# Patient Record
Sex: Female | Born: 1970 | Race: White | Hispanic: No | Smoking: Never smoker
Health system: Southern US, Community
[De-identification: ages and names within clinical notes are randomized; demographics above are authoritative.]

## PROBLEM LIST (undated history)

## (undated) DIAGNOSIS — J45998 Other asthma: Secondary | ICD-10-CM

## (undated) DIAGNOSIS — F419 Anxiety disorder, unspecified: Secondary | ICD-10-CM

## (undated) DIAGNOSIS — E282 Polycystic ovarian syndrome: Secondary | ICD-10-CM

## (undated) DIAGNOSIS — R112 Nausea with vomiting, unspecified: Secondary | ICD-10-CM

## (undated) DIAGNOSIS — N200 Calculus of kidney: Secondary | ICD-10-CM

## (undated) DIAGNOSIS — G4730 Sleep apnea, unspecified: Secondary | ICD-10-CM

## (undated) DIAGNOSIS — C801 Malignant (primary) neoplasm, unspecified: Secondary | ICD-10-CM

## (undated) DIAGNOSIS — F329 Major depressive disorder, single episode, unspecified: Secondary | ICD-10-CM

## (undated) DIAGNOSIS — G43909 Migraine, unspecified, not intractable, without status migrainosus: Secondary | ICD-10-CM

## (undated) DIAGNOSIS — Z9889 Other specified postprocedural states: Secondary | ICD-10-CM

## (undated) DIAGNOSIS — Z87442 Personal history of urinary calculi: Secondary | ICD-10-CM

## (undated) DIAGNOSIS — Z973 Presence of spectacles and contact lenses: Secondary | ICD-10-CM

## (undated) DIAGNOSIS — D509 Iron deficiency anemia, unspecified: Secondary | ICD-10-CM

## (undated) DIAGNOSIS — F32A Depression, unspecified: Secondary | ICD-10-CM

## (undated) DIAGNOSIS — K219 Gastro-esophageal reflux disease without esophagitis: Secondary | ICD-10-CM

## (undated) DIAGNOSIS — D649 Anemia, unspecified: Secondary | ICD-10-CM

## (undated) DIAGNOSIS — Z87820 Personal history of traumatic brain injury: Secondary | ICD-10-CM

## (undated) DIAGNOSIS — N393 Stress incontinence (female) (male): Secondary | ICD-10-CM

## (undated) HISTORY — PX: KNEE ARTHROSCOPY W/ ACL RECONSTRUCTION: SHX1858

## (undated) HISTORY — DX: Migraine, unspecified, not intractable, without status migrainosus: G43.909

## (undated) HISTORY — PX: TOTAL KNEE ARTHROPLASTY: SHX125

---

## 1999-11-25 ENCOUNTER — Other Ambulatory Visit: Admission: RE | Admit: 1999-11-25 | Discharge: 1999-11-25 | Payer: Self-pay | Admitting: Gynecology

## 1999-11-25 ENCOUNTER — Encounter (INDEPENDENT_AMBULATORY_CARE_PROVIDER_SITE_OTHER): Payer: Self-pay | Admitting: Specialist

## 2000-04-16 ENCOUNTER — Encounter: Payer: Self-pay | Admitting: Orthodontics and Dentofacial Orthopedics

## 2000-04-16 ENCOUNTER — Ambulatory Visit (HOSPITAL_COMMUNITY)
Admission: RE | Admit: 2000-04-16 | Discharge: 2000-04-16 | Payer: Self-pay | Admitting: Orthodontics and Dentofacial Orthopedics

## 2000-05-10 ENCOUNTER — Encounter: Admission: RE | Admit: 2000-05-10 | Discharge: 2000-08-08 | Payer: Self-pay | Admitting: Gynecology

## 2000-09-28 ENCOUNTER — Ambulatory Visit (HOSPITAL_COMMUNITY): Admission: RE | Admit: 2000-09-28 | Discharge: 2000-09-28 | Payer: Self-pay | Admitting: Orthopedic Surgery

## 2000-09-28 ENCOUNTER — Encounter: Payer: Self-pay | Admitting: Orthopedic Surgery

## 2000-10-12 ENCOUNTER — Ambulatory Visit (HOSPITAL_COMMUNITY): Admission: RE | Admit: 2000-10-12 | Discharge: 2000-10-12 | Payer: Self-pay | Admitting: Orthopedic Surgery

## 2000-10-12 ENCOUNTER — Encounter: Payer: Self-pay | Admitting: Orthopedic Surgery

## 2000-10-26 ENCOUNTER — Encounter: Payer: Self-pay | Admitting: Orthopedic Surgery

## 2000-10-26 ENCOUNTER — Ambulatory Visit (HOSPITAL_COMMUNITY): Admission: RE | Admit: 2000-10-26 | Discharge: 2000-10-26 | Payer: Self-pay | Admitting: Orthopedic Surgery

## 2000-11-25 ENCOUNTER — Other Ambulatory Visit: Admission: RE | Admit: 2000-11-25 | Discharge: 2000-11-25 | Payer: Self-pay | Admitting: Gynecology

## 2000-12-21 HISTORY — PX: BACK SURGERY: SHX140

## 2001-04-29 ENCOUNTER — Encounter: Payer: Self-pay | Admitting: Orthopedic Surgery

## 2001-04-29 ENCOUNTER — Encounter: Admission: RE | Admit: 2001-04-29 | Discharge: 2001-04-29 | Payer: Self-pay | Admitting: Orthopedic Surgery

## 2001-05-13 ENCOUNTER — Encounter: Payer: Self-pay | Admitting: Orthopedic Surgery

## 2001-05-13 ENCOUNTER — Encounter: Admission: RE | Admit: 2001-05-13 | Discharge: 2001-05-13 | Payer: Self-pay | Admitting: Orthopedic Surgery

## 2001-06-01 ENCOUNTER — Encounter: Payer: Self-pay | Admitting: Orthopedic Surgery

## 2001-06-01 ENCOUNTER — Encounter: Admission: RE | Admit: 2001-06-01 | Discharge: 2001-06-01 | Payer: Self-pay | Admitting: Orthopedic Surgery

## 2001-11-08 ENCOUNTER — Encounter: Payer: Self-pay | Admitting: Neurosurgery

## 2001-11-08 ENCOUNTER — Inpatient Hospital Stay (HOSPITAL_COMMUNITY): Admission: RE | Admit: 2001-11-08 | Discharge: 2001-11-12 | Payer: Self-pay | Admitting: Neurosurgery

## 2002-01-31 ENCOUNTER — Encounter: Payer: Self-pay | Admitting: Neurosurgery

## 2002-01-31 ENCOUNTER — Encounter: Admission: RE | Admit: 2002-01-31 | Discharge: 2002-01-31 | Payer: Self-pay | Admitting: Neurosurgery

## 2002-02-09 ENCOUNTER — Other Ambulatory Visit: Admission: RE | Admit: 2002-02-09 | Discharge: 2002-02-09 | Payer: Self-pay | Admitting: Obstetrics and Gynecology

## 2003-02-09 ENCOUNTER — Other Ambulatory Visit: Admission: RE | Admit: 2003-02-09 | Discharge: 2003-02-09 | Payer: Self-pay | Admitting: Gynecology

## 2004-04-04 ENCOUNTER — Encounter: Admission: RE | Admit: 2004-04-04 | Discharge: 2004-04-04 | Payer: Self-pay | Admitting: Family Medicine

## 2004-06-03 ENCOUNTER — Encounter: Admission: RE | Admit: 2004-06-03 | Discharge: 2004-06-03 | Payer: Self-pay | Admitting: Gastroenterology

## 2005-02-27 ENCOUNTER — Ambulatory Visit (HOSPITAL_COMMUNITY): Admission: RE | Admit: 2005-02-27 | Discharge: 2005-02-27 | Payer: Self-pay | Admitting: Gynecology

## 2005-04-23 ENCOUNTER — Inpatient Hospital Stay (HOSPITAL_COMMUNITY): Admission: AD | Admit: 2005-04-23 | Discharge: 2005-04-23 | Payer: Self-pay | Admitting: Gynecology

## 2005-05-08 ENCOUNTER — Other Ambulatory Visit: Admission: RE | Admit: 2005-05-08 | Discharge: 2005-05-08 | Payer: Self-pay | Admitting: Gynecology

## 2005-06-03 ENCOUNTER — Ambulatory Visit (HOSPITAL_COMMUNITY): Admission: RE | Admit: 2005-06-03 | Discharge: 2005-06-03 | Payer: Self-pay | Admitting: Gynecology

## 2005-07-11 ENCOUNTER — Inpatient Hospital Stay (HOSPITAL_COMMUNITY): Admission: AD | Admit: 2005-07-11 | Discharge: 2005-07-16 | Payer: Self-pay | Admitting: Gynecology

## 2005-07-13 ENCOUNTER — Encounter (INDEPENDENT_AMBULATORY_CARE_PROVIDER_SITE_OTHER): Payer: Self-pay | Admitting: *Deleted

## 2006-03-24 ENCOUNTER — Encounter: Admission: RE | Admit: 2006-03-24 | Discharge: 2006-03-24 | Payer: Self-pay | Admitting: Gynecology

## 2006-07-18 ENCOUNTER — Inpatient Hospital Stay (HOSPITAL_COMMUNITY): Admission: AD | Admit: 2006-07-18 | Discharge: 2006-07-19 | Payer: Self-pay | Admitting: Gynecology

## 2007-12-22 HISTORY — PX: REDUCTION MAMMAPLASTY: SUR839

## 2008-02-14 ENCOUNTER — Other Ambulatory Visit: Admission: RE | Admit: 2008-02-14 | Discharge: 2008-02-14 | Payer: Self-pay | Admitting: Family Medicine

## 2008-12-21 HISTORY — PX: BREAST SURGERY: SHX581

## 2009-04-16 ENCOUNTER — Other Ambulatory Visit: Admission: RE | Admit: 2009-04-16 | Discharge: 2009-04-16 | Payer: Self-pay | Admitting: Family Medicine

## 2009-05-21 ENCOUNTER — Ambulatory Visit: Payer: Self-pay | Admitting: Gynecology

## 2009-05-24 ENCOUNTER — Ambulatory Visit: Payer: Self-pay | Admitting: Gynecology

## 2010-04-30 ENCOUNTER — Other Ambulatory Visit: Admission: RE | Admit: 2010-04-30 | Discharge: 2010-04-30 | Payer: Self-pay | Admitting: Family Medicine

## 2010-12-21 DIAGNOSIS — G4733 Obstructive sleep apnea (adult) (pediatric): Secondary | ICD-10-CM

## 2010-12-21 HISTORY — DX: Obstructive sleep apnea (adult) (pediatric): G47.33

## 2011-02-10 ENCOUNTER — Other Ambulatory Visit: Payer: Self-pay | Admitting: Dermatology

## 2011-05-08 NOTE — Op Note (Signed)
[name]  Patient:    Adriana Newman, HUGE Visit Number: [id number] MRN: [id number]          Service Type: SUR Location: 3000 3040 01 Attending Physician:  Colon Branch Dictated by:   Clydene Fake, M.D. Proc. Date: 11/08/01 Admit Date:  11/08/2001                             Operative Report  PREOPERATIVE DIAGNOSIS:  Stenosis and spondylolisthesis, L5-S1.  POSTOPERATIVE DIAGNOSIS:  Stenosis and spondylolisthesis, L5-S1.  PROCEDURE: 1. Gill decompression of the lamina of L5-S1. 2. Posterior lumbar interbody fusion L5-S1 with Brantigan interbody cages at    L5-S1. 3. Pedicle screw fixation at L5-S1. 4. Autograft from same incision. 5. Open reduction and internal fixation of spondylolisthesis.  SURGEON:  Clydene Fake, M.D.  ASSISTANT:  Izell [place]. Elesa Hacker, M.D.  ANESTHESIA:  General endotracheal tube anesthesia.  ESTIMATED BLOOD LOSS:  250 cc.  BLOOD GIVEN:  None.  DRAINS:  None.  COMPLICATIONS:  None.  REASON FOR PROCEDURE:  The patient is a a young woman who has had years of back pain that has been progressively worsening with bilateral S1 radiculopathy, worse on the right.  Has some improvement with epidural injections, the last time though only lasted a couple of months.  The patient has progression.  She has undergone an MRI and myelogram showing grade 1 spondylolisthesis and stenosis at the 5-1 level.  She is brought in for decompression and fusion.  DESCRIPTION OF PROCEDURE:  The patient was brought in the operating room, and general anesthesia was induced.  The patient was placed in a prone position on the Wilson frame with all pressure points padded.  The patient was prepped and draped in a sterile fashion.  The site of incision was then injected with 20 cc of 1% lidocaine with epinephrine.  Incision was then made from the L4 spinous process down to the sacrum in the midline and the lower lumbar spine incision taken  to the fascia and hemostasis obtained with Bovie cauterization. The fascia was incised with the Bovie.  Subperiosteal dissection was done over the L4, L5 and the sacrum bilaterally.  Dissection was taken out to lateral to the facets at L4-5 and S5-1.  Fluoroscopic imaging was obtained to confirm the levels, and L5 laminectomy was performed along with a superior S1 laminectomy along with decompression of the pars and facets in a Gill-type decompression for good decompression of the spondylolisthesis.  The S1 roots were under severe compression in the ligament-bony canal and due to the spondylolisthesis.  L5 roots were under somewhat compression, and these were deep down into the incision.  We decompressed the L5 and S1 roots bilaterally. Attention was then taken to the disk space, and diskectomy was performed after incising the disk space.  The angle of the disk space was almost vertical to the rest of the patients back, making the operation fairly difficult.  After diskectomy, we distracted the disk space with interbody distractors up to 11 mm.  We then prepared the interspaces for interbody cages, and 11 mm high x 9 mm high posteriorly wedge Brantigan cages were then placed into the 5-1 interspace.  The bone removed from the laminectomy was chopped up into small pieces and to this was added some Grafton putty, and this mixture was then packed in with the cages.  Prior to placing the cages into the disk space, this  bone material was placed anterior to where the cages were going.  They were tapped into place under fluoroscopic imaging.  At this point we had restored some of the lordosis to the lumbar spine and reduced the spondylolisthesis slightly.  Attention was given to pedicle screw insertion sites at L5 and S1.  That bone was decorticated with the high-speed drill. Fluoroscopic imaging was used for guidance along with actual visualizing the medial pedicle and pedicle probe was placed  down the L5 pedicles bilaterally and then a 6.25 x 40 mm long _____ screw was then placed in each L5 pedicle. A 30 x 7 mm wide _____ was placed in S1 bilaterally.  Rods were bent to fit and then placed over the screw heads, and the locking nut was then placed on. We tightened the locking nut over the L5 screws bilaterally and then with compression over the 5-1 interspace compressing the cages, we then tightened the S1.  We did this bilaterally.  At this point, AP and lateral fluoroscopic imaging was obtained showing good position of the pedicle screws, rods, and the interbody cages.  We visually explored the cages.  They were tight.  There was no movement.  We again explored the nerve roots.  The L5 and S1 roots were decompressed.  We did use an osteophyte remover to remove the rim of S1 under the S1 roots to decrease the acuity of that angle and compression anteriorly of the S1 roots.  The wound was irrigated with antibiotic solution and epidural hemostasis was obtained with Gelfoam and thrombin.  Retractors were removed and the paraspinous muscles and fascia were closed with 0 Vicryl interrupted suture, the subcutaneous tissue was closed with 0, 2-0, and 3-0 Vicryl interrupted sutures, and the skin closed with Steri-Strips after benzoin was placed.   A dressing was applied.  The patient was placed back in a supine position, awoken from anesthesia, and transferred to the recovery room in stable condition. Dictated by:   Clydene Fake, M.D. Attending Physician:  Colon Branch DD:  11/08/01 TD:  11/09/01 Job: [id number]

## 2011-05-08 NOTE — Discharge Summary (Signed)
[place]  Patient:    Adriana Newman, Adriana Newman Visit Number: [id number] MRN: [id number]          Service Type: SUR Location: 3000 3040 01 Attending Physician:  Colon Branch Dictated by:   Clydene Fake, M.D. Admit Date:  11/08/2001 Discharge Date: 11/12/2001                             Discharge Summary  DIAGNOSIS:  Spondylolisthesis and stenosis at L5-S1.  DISCHARGE DIAGNOSIS:  Spondylolisthesis and stenosis at L5-S1.  PROCEDURE:  Gill decompressive laminectomy with posterior lumbar interbody fusion L5-S1 with interbody cages and pedicle screw fixation with autograft from the same incision and open reduction of spondylolisthesis.  REASON FOR ADMISSION:  The patient is a 40 year old woman who has had many years of back and leg pain, right worse than left but over the last year it has been progressing.  MRI and myelogram have been done showing stenosis, disc bulging and spondylolithesis at L5-S1.  There is no abnormal motion with flexion and extension though.  Epidural injections have helped but with less lasting effect as she has been getting more and the patient is brought in for decompression and fusion.  HOSPITAL COURSE:  The patient was admitted the day of surgery and underwent the procedure above without complications.  Postoperatively the patient was transferred to the recovery room and then to the floor.  There she was up ambulating with a brace and noted that she was moving her legs well.  She had a little left side foot numbness.  PT worked with the patient.  Her activity has been improving.  She had a little low grade fever probably consistent with atelectasis which has improved over the first couple of days.  She might have had a mild ileus but she had been passing flatus and eating better, though she no bowel movements at the time of discharge.  PCA was discontinued.  She was taking oral pain medicines well and having good pain  control.  She still complains of a little numbness in the left heel and the upper left leg that is an improvement from immediately after surgery and this will probably continue to resolve with further time.  Incision has remained clean, dry and intact.  CONDITION ON DISCHARGE:  To home in stable condition.  DISCHARGE MEDICATIONS:  Same as prehospitalization except no nonsteroidal anti-inflammatories.  She can have Percocet one to two p.o. q.4-6h. p.r.n. pain.  Flexeril 10 mg p.o. q.8h. p.r.n. spasms.  DIET:  As tolerated.  ACTIVITY:  With brace only.  No strenuous activity.  WOUND CARE:  Keep incision dry for five days.  FOLLOW-UP:  Will be in three weeks in my office. Dictated by:   Clydene Fake, M.D. Attending Physician:  Colon Branch DD:  11/12/01 TD:  11/14/01 Job: [id number]

## 2011-05-08 NOTE — Discharge Summary (Signed)
[name], Adriana Newman           ACCOUNT NO.:  [id number]   MEDICAL RECORD NO.:  [id number]          PATIENT TYPE:  INP   LOCATION:  9302                          [facility]ILITY[facility]   PHYSICIAN:  Juan H. Lily Peer, M.D.DATE OF BIRTH:  Apr 20, 1971   DATE OF ADMISSION:  07/11/2005  DATE OF DISCHARGE:  07/16/2005                                 DISCHARGE SUMMARY   Total days hospitalized:  5.   HISTORY:  The patient is a 40 year old at 22 weeks 5 days who was admitted  on July 11, 2005. The patient with known triplets. She was admitted in  preterm labor and was noted to have some slight bloody show. It was presumed  on admission that the slight bloody show had been attributed to her preterm  labor. She was started on tocolytic agents consisting of magnesium sulfate 4  g bolus followed by 2 g. Prior appropriate cultures for group B strep had  been obtained which the results later came back negative. But initially she  was started on clindamycin because of her penicillin allergies and  gentamycin was added later for more broader coverage of gram-negative  organisms. She continued to contract on and off and was passing blood clots  on several occasions. She had had an ultrasound which demonstrated no  measurable cervix. The internal os found to be 3 cm dilated. She had a DIC  panel that was drawn which was normal. The intention was to discontinue the  magnesium sulfate if there was any assumption of a potential abruptio  placenta. Fetal heart tones were appreciated on all three of the fetuses,  but no gross evidence of abruption was noted on the ultrasound. She was  continued on her magnesium sulfate and tocolytic agents with follow up of  her DIC panel which continued to remain stable. She had been crossmatched  for 2 units of blood in the event that she would of needed it. On the  morning of July 24 she continued to contract and was having much greater  bloody show, and she was found to be  5 cm dilated with bulging membranes and  a foot could be felt through the sac. She was counseled and taken for  primary lower uterine segment transverse cesarean section which was  performed by Dr. Douglass Rivers and assisted by Gaetano Hawthorne. Lily Peer, M.D.  Findings:  There was no gross evidence of abruption. There was clear  amniotic fluid throughout all three sacs. Baby A was delivered viable,  Apgars were 4, 4, and 7. Birth weight was 575 g and had been in the breech  presentation. Baby B was a viable female, Apgars of 7 and 8, birth weight of  447 g and vertex. Baby C was a viable female, Apgars of 1, 1, 5 and 7. Birth  weight of 422 g, delivered vertex. Normal uterus without any evidence of  Couvelaire changes. The ovaries were enlarged bilaterally. Postpartum day #1  hemoglobin/hematocrit were 8.8 and 26.1 respectively with a platelet count  259,000. The patient was afebrile. Her blood type is O positive, rubella  immune. By the first postoperative day,  all three babies had died in the  neonatal intensive care unit. Pastoral care had been provided for the  patient. Her postoperative day #2 she was grieving but vital signs were  fine. She was kept another day in the hospital and she was started on  Effexor for depression consisting of 150 mg daily. She was given  prescription of Percocet 5/325 one p.o. q.4-6h. p.r.n. and Ambien CR 12.5  p.o. q.h.s. p.r.n. for insomnia. She was essentially discharged home on the  following day, on July 27 on her third postoperative day with stable vital  signs.Marland Kitchen   FINAL DIAGNOSES:  1.  A 23-week triplet pregnancy, delivered.  2.  Questionable abruptio.  3.  Advanced cervical dilatation.   PROCEDURE PERFORMED:  Primary lower uterine segment transverse cesarean.   FINAL DISPOSITION AND FOLLOW-UP:  By the first postoperative day all three  babies had died in the neonatal intensive care unit of prematurity. The  patient had been started on antidepressants to  help her with her emotions  and had been started on Effexor 150 mg daily and for pain relief she was  given Percocet 5/325 one to two tablets p.o.  q.4-6h. p.r.n. pain, and for insomnia she was provided with Ambien CR 12.5  mg q.h.s. p.r.n.Marland Kitchen She is instructed to continue prenatal vitamins and iron  and was instructed to follow up in the office in 2-3 weeks for her first  postoperative visit and to see how she was doing with the antidepressant  agent.      Juan H. Lily Peer, M.D.  Electronically Signed     JHF/MEDQ  D:  08/18/2005  T:  08/18/2005  Job:  [id number]

## 2011-05-08 NOTE — Op Note (Signed)
Adriana Newman, Adriana Newman           ACCOUNT NO.:  [id number]   MEDICAL RECORD NO.:  [id number]          PATIENT TYPE:  INP   LOCATION:  9199                          FACILITY:  [facility]   PHYSICIAN:  Ivor Costa. Farrel Gobble, M.D. DATE OF BIRTH:  1971/01/16   DATE OF PROCEDURE:  07/13/2005  DATE OF DISCHARGE:                                 OPERATIVE REPORT   PREOPERATIVE DIAGNOSES:  1.  Twenty-three week triplet pregnancy.  2.  Questionable abruption, baby C.  3.  Advanced cervical dilation.   POSTOPERATIVE DIAGNOSES:  1.  Twenty-three week triplet pregnancy.  2.  Questionable abruption, baby C.  3.  Advanced cervical dilation.   PROCEDURE:  Primary cesarean section, low flap transverse.   SURGEONS:  Ivor Costa. Farrel Gobble, M.D., and Gaetano Hawthorne. Lily Peer, M.D.   ANESTHESIA:  Spinal.   IV FLUIDS:  1600 mL of lactated Ringer's.   ESTIMATED BLOOD LOSS:  500 mL.   URINE OUTPUT:  500 mL of clear urine.   FINDINGS:  There was no gross evidence of abruption.  There was clear  amniotic fluid noted around all three sacs.  Baby A was delivered of a  viable female, delivery confirmed a portion of the infant was in the maternal  vagina.  Membranes were intact.  Apgars were 4, 4 and 7, birth weight was  575, and he was delivered breech.  Baby B was a viable female, Apgars were 7  and 8, birth weight was 447, and vertex.  Baby C was a viable female, Apgars  were 1, 1, 5 and 7, birth weight was 422, delivered vertex.  Normal uterus  without any evidence of Couvelaire changes.  The ovaries were enlarged  bilaterally.   COMPLICATIONS:  None.   PATHOLOGY:  Placenta x3.   PROCEDURE:  The patient was taken to the operating room and spinal  anesthesia was induced, placed in the dorsal lithotomy position, prepped and  draped in the usual sterile fashion.  After adequate anesthesia was ensured,  a Pfannenstiel skin incision was made with a scalpel and carried through to  the underlying layer of fascia with  electrocautery.  The fascia was scored  in the midline and the incision was extended laterally with electrocautery.  The inferior aspect of the fascial incision was grasped with Kochers.  The  underlying rectus muscles were dissected off by blunt and sharp dissection.  In a similar fashion, the superior aspect of the incision was grasped with  Kochers and the underlying rectus muscles were dissected off.  The rectus  muscles were naturally separated in the midline.  The peritoneum was  identified and entered bluntly.  The peritoneal incision was then extended  superiorly and inferiorly with good visualization of the underlying bowel  and bladder.  The bladder blade was inserted, the vesicouterine peritoneum  was identified, tented up and entered sharply with the Metzenbaums.  The  incision was extended laterally, the bladder flap was created digitally.  The bladder blade was then reinserted and the lower uterine segment was  incised in a transverse fashion with a scalpel.  The uterine cavity was then  entered bluntly and the incision was also extended bluntly.  Immediately  upon entering the cavity, baby A's vertex was visible.  An examining hand  was placed into the uterus.  The baby was elevated, and it was noted to be  markedly in the vagina.  He was elevated, brought through the uterus, and  then an amniotomy was performed.  The cord was cut and clamped and he was  handed off to the waiting pediatrician.  The vertex of baby B, female, was  then held, and she was brought similarly through the uterine incision and  again an amniotomy was performed.  The cord was cut and clamped and again  handed off to the waiting pediatrician.  A cord clamp was placed for later  identification.  Baby C was similarly brought into the incision, amniotomy,  and cord cut and clamped with two clamps left for later identification.  The  placenta of A was transected, that of C, spontaneously separated.  The   placenta of baby B was manually removed.  The uterus was then cleared of all  clots and debris.  The uterine incision was repaired with a running locked  layer of 0 chromic and a second suture was used for imbrication.  The pelvis  was irrigated with copious amounts of warm saline.  The adnexa were  inspected and found as above.  The uterus was closely inspected, and there  were no Couvelaire changes.  The uterus also contracted nicely post  delivery.  The peritoneum, fascia and muscle were then inspected and treated  where appropriate.  The fascia was closed with 0 Vicryl in a running  fashion.  The subcu was irrigated.  The skin was closed with 4-0 Vicryl on a  Mellody Dance, followed by injection of 10 mL of 0.25% Marcaine and Steri-Strips.  The patient tolerated the procedure well.  Sponge, lap and needle count was  correct x2, and she was transferred to the PACU in stable condition.  She  recieved Ancef intra-operatively.      Ivor Costa. Farrel Gobble, M.D.  Electronically Signed     THL/MEDQ  D:  07/13/2005  T:  07/13/2005  Job:  [id number]

## 2011-05-08 NOTE — H&P (Signed)
. [facility]  Patient:    Adriana Newman, Adriana Newman Visit Number: [id number] MRN: [id number]          Service Type: SUR Location: 3000 3040 01 Attending Physician:  Colon Branch Dictated by:   Clydene Fake, M.D. Admit Date:  11/08/2001                           History and Physical  CHIEF COMPLAINT:  Back and leg pain, right worse than left.  HISTORY:  Patient is a 40 year old woman who for years has had back and leg pain that has been worsening over the last year or so.  In September of 2001, she had an MRI and found to have a stenosis, disk bulging and spondylolisthesis at L5-S1.  She was treated with epidural steroid injections which did give her relief for about four to five months and then the pain recurred.  She went through another series of epidural injections, the last in June; relief was only two to three months, with some pain recurring and now worsening.  She has been through physical therapy and does try to continue doing the exercises on her own.  Pain is across the back, worse with sitting. The pain radiates down the right leg to the bottom of foot, especially when walking, and occasionally this happens with the left side also.  Occasionally, she has burning-type pain.  She has been on Neurontin in the past which did not help, currently on Celebrex which gives a little relief.  No bowel or bladder complaints.  No weakness or numbness.  Workup included an MRI and a myelogram which shows spondylolithesis at 5-1 with degenerative disk disease and disk height narrowing, no abnormal motion with flexion and extension; there is the grade 1 spondylolisthesis.  There is stenosis at the 5-1 level due to spur and disk bulge and very small canal and a kink in the S1 roots on the myelogram.  It is seen right as the roots go over them with the best one. Patient is brought in for decompression and fusion.  PAST MEDICAL HISTORY:  Significant for  arthritis, depression, polycystic ovarian syndrome.  PAST SURGICAL HISTORY:  Previous operations include bilateral knee surgeries, two on the left and two on the right.  CURRENT MEDICATIONS:  Lo/Ovral, spironolactone, Celebrex, Effexor, Allegra, Skelaxin.  DRUG ALLERGIES:  PENICILLIN, which caused hives as a child.  SOCIAL HISTORY:  She is single, employed as a Runner, broadcasting/film/video for the fifth grade, does not smoke or drink alcohol.  REVIEW OF SYSTEMS:  Otherwise negative.  FAMILY HISTORY:  Noncontributory.  PHYSICAL EXAMINATION:  GENERAL:  Patient is pleasant, in no apparent distress.  Weight is 189 pounds. Height 5 feet 7 inches.  VITAL SIGNS:  Blood pressure 117/95, pulse 78, temperature 98.6.  HEENT:  Unremarkable.  NECK:  Supple.  LUNGS:  Clear.  HEART:  Regular rhythm.  ABDOMEN:  Soft and nontender.  EXTREMITIES:  Intact.  No edema.  BACK AND NEUROLOGIC:  Exam show that range of motion of the back is done well in flexion and extension and it does not increase her symptoms.  Gait is done normally; she can ambulate on heels and toes.  Straight leg raising is positive bilaterally with pain radiating down towards the heel, worse on the right side.  Sensation is intact to light touch and pinprick.  Deep tendon reflexes are 3+ at the knees, 2 at the ankles, equal bilaterally.  ______ motor testing shows 5/5 in all muscle groups.  ASSESSMENT AND PLAN:  Patient admitted for decompression and fusion of L5-S1. Dictated by:   Clydene Fake, M.D. Attending Physician:  Colon Branch DD:  11/08/01 TD:  11/09/01 Job: [id number]

## 2011-05-08 NOTE — H&P (Signed)
NAMEVALLERY, Adriana Newman           ACCOUNT NO.:  [id number]   MEDICAL RECORD NO.:  [id number]          PATIENT TYPE:  INP   LOCATION:  9149                          [facility]   PHYSICIAN:  Charles A. Delcambre, MDDATE OF BIRTH:  September 21, 1971   DATE OF ADMISSION:  07/11/2005  DATE OF DISCHARGE:                                HISTORY & PHYSICAL   CHIEF COMPLAINT:  Vaginal bleeding, cramping, triplet pregnancy.   HISTORY OF PRESENT ILLNESS:  A 40 year old para 0-0-0-0, estimated  gestational age [redacted] weeks 5 days, [facility] November 09, 2005, who presented  complaining of passing a small clot and some cramping.  She was noted to  have contractions every four to five minutes after an ultrasound showed the  cervix to be 1.2 cm with normal otherwise gestational findings on a triplet  pregnancy, with fetal heart tones noted on each baby.  She notes active  fetal movement and otherwise denies ruptured membranes.  She denies fever or  chills, urgency, frequency or dysuria.   PAST MEDICAL HISTORY:  Depression, asthma, GERD, allergies.   SURGICAL HISTORY:  Knee surgery, two on each side.  Spinal fusion.   MEDICATIONS:  1.  Prenatal vitamins.  2.  Effexor XR 75 mg daily.  3.  Pulmicort Turbohaler one to two puffs daily.  4.  Proventil inhaler two puffs q.4-6h. p.r.n.  5.  Protonix 40 mg p.o. daily.  6.  Zyrtec daily p.r.n.   ALLERGIES:  PENICILLIN causes hives.   SOCIAL HISTORY:  No tobacco, ethanol or drug use.  The patient is married.   FAMILY HISTORY:  States no major illnesses, noncontributory.   PHYSICAL EXAMINATION:  VITAL SIGNS:  Normal vital signs per R.N.  See  admission sheets.  HEENT:  Grossly normal.  CHEST:  Lungs are clear bilaterally.  CARDIAC:  Regular rate and rhythm.  ABDOMEN:  Soft, nontender, gravid.  PELVIC:  Per R.N. in MAU, stated 1 cm dilated, no significant bleeding noted  in MAU.  EXTREMITIES:  Minimal edema, nontender.   ASSESSMENT:  Preterm labor versus  incompetent cervix, triplet pregnancy at  22 weeks 5 days.  Favor preterm labor.   PLAN:  1.  Group B strep culture.  2.  Clindamycin.  3.  Urinalysis.  4.  CBC is on the chart.  5.  Magnesium tocolysis.  6.  Complete bed rest.  7.  FHT's q shift  8.  Discussed management, extended bed rest, tocolysis, neonatology      consultation would be in order if once reached viability gestation.      Would proceed on with tocolysis at this time, withhold steroids at this      gestation.  With contractions, will hold on at this gestation in a      triplet pregnancy a rescue cerclage although it has been considered.      The patient agrees with management as outlined.  Possibility to convert      over to Procardia or terbutaline with response to magnesium tocolysis.      Consisder BMZ as [redacted] weeks gestational age approaches, maintain strict  bedrest for now.       CAD/MEDQ  D:  07/11/2005  T:  07/11/2005  Job:  [id number]

## 2011-12-07 ENCOUNTER — Other Ambulatory Visit: Payer: Self-pay | Admitting: Orthopedic Surgery

## 2011-12-08 ENCOUNTER — Encounter (HOSPITAL_COMMUNITY): Payer: Self-pay

## 2011-12-11 ENCOUNTER — Encounter (HOSPITAL_COMMUNITY): Payer: Self-pay

## 2011-12-11 ENCOUNTER — Ambulatory Visit (HOSPITAL_COMMUNITY)
Admission: RE | Admit: 2011-12-11 | Discharge: 2011-12-11 | Disposition: A | Discharge status: Home / Self Care | Payer: BC Managed Care – PPO | Source: Ambulatory Visit | Attending: Orthopedic Surgery | Admitting: Orthopedic Surgery

## 2011-12-11 ENCOUNTER — Encounter (HOSPITAL_COMMUNITY)
Admission: RE | Admit: 2011-12-11 | Discharge: 2011-12-11 | Disposition: A | Discharge status: Home / Self Care | Payer: BC Managed Care – PPO | Source: Ambulatory Visit | Attending: Orthopedic Surgery | Admitting: Orthopedic Surgery

## 2011-12-11 ENCOUNTER — Other Ambulatory Visit: Payer: Self-pay

## 2011-12-11 DIAGNOSIS — J45909 Unspecified asthma, uncomplicated: Secondary | ICD-10-CM | POA: Insufficient documentation

## 2011-12-11 DIAGNOSIS — Z01810 Encounter for preprocedural cardiovascular examination: Secondary | ICD-10-CM | POA: Insufficient documentation

## 2011-12-11 DIAGNOSIS — Z01812 Encounter for preprocedural laboratory examination: Secondary | ICD-10-CM | POA: Insufficient documentation

## 2011-12-11 DIAGNOSIS — Z01818 Encounter for other preprocedural examination: Secondary | ICD-10-CM | POA: Insufficient documentation

## 2011-12-11 HISTORY — DX: Major depressive disorder, single episode, unspecified: F32.9

## 2011-12-11 HISTORY — DX: Sleep apnea, unspecified: G47.30

## 2011-12-11 HISTORY — DX: Polycystic ovarian syndrome: E28.2

## 2011-12-11 HISTORY — DX: Depression, unspecified: F32.A

## 2011-12-11 HISTORY — DX: Nausea with vomiting, unspecified: R11.2

## 2011-12-11 HISTORY — DX: Nausea with vomiting, unspecified: Z98.890

## 2011-12-11 HISTORY — DX: Anemia, unspecified: D64.9

## 2011-12-11 HISTORY — DX: Gastro-esophageal reflux disease without esophagitis: K21.9

## 2011-12-11 LAB — HCG, SERUM, QUALITATIVE: Preg, Serum: NEGATIVE

## 2011-12-11 LAB — DIFFERENTIAL
Basophils Absolute: 0 10*3/uL (ref 0.0–0.1)
Lymphocytes Relative: 36 % (ref 12–46)
Lymphs Abs: 2.1 10*3/uL (ref 0.7–4.0)
Neutro Abs: 3.3 10*3/uL (ref 1.7–7.7)
Neutrophils Relative %: 55 % (ref 43–77)

## 2011-12-11 LAB — URINE MICROSCOPIC-ADD ON

## 2011-12-11 LAB — CBC
HCT: 38.6 % (ref 36.0–46.0)
Hemoglobin: 13.4 g/dL (ref 12.0–15.0)
MCH: 32.3 pg (ref 26.0–34.0)
MCHC: 34.7 g/dL (ref 30.0–36.0)
MCV: 93 fL (ref 78.0–100.0)

## 2011-12-11 LAB — COMPREHENSIVE METABOLIC PANEL
BUN: 13 mg/dL (ref 6–23)
Calcium: 9.5 mg/dL (ref 8.4–10.5)
Creatinine, Ser: 0.72 mg/dL (ref 0.50–1.10)
GFR calc Af Amer: >90 mL/min (ref >90)
GFR calc non Af Amer: >90 mL/min (ref >90)
Glucose, Bld: 94 mg/dL (ref 70–99)
Sodium: 139 mEq/L (ref 135–145)
Total Protein: 7.2 g/dL (ref 6.0–8.3)

## 2011-12-11 LAB — PROTIME-INR
INR: 0.91 (ref 0.00–1.49)
Prothrombin Time: 12.4 seconds (ref 11.6–15.2)

## 2011-12-11 LAB — TYPE AND SCREEN: Antibody Screen: NEGATIVE

## 2011-12-11 LAB — URINALYSIS, ROUTINE W REFLEX MICROSCOPIC
Bilirubin Urine: NEGATIVE
Hgb urine dipstick: NEGATIVE
Ketones, ur: NEGATIVE mg/dL
Nitrite: NEGATIVE
Urobilinogen, UA: 1 mg/dL (ref 0.0–1.0)

## 2011-12-11 LAB — SURGICAL PCR SCREEN: Staphylococcus aureus: NEGATIVE

## 2011-12-11 NOTE — Pre-Procedure Instructions (Signed)
20 Adriana Newman  12/11/2011   Your procedure is scheduled on:  Wednesday, Dec 26  Report to [facility] at 1100 AM.  Call this number if you have problems the morning of surgery: [phone]   Remember:   Do not eat food:After Midnight.  May have clear liquids: up to 4 Hours before arrival.  Clear liquids include soda, tea, black coffee, apple or grape juice, broth.  Take these medicines the morning of surgery with A SIP OF WATER: *Cymbalta,nasal spray, pulmicort,Prilosec,Hydrocodone**   Do not wear jewelry, make-up or nail polish.  Do not wear lotions, powders, or perfumes. You may wear deodorant.  Do not shave 48 hours prior to surgery.  Do not bring valuables to the hospital.  Contacts, dentures or bridgework may not be worn into surgery.  Leave suitcase in the car. After surgery it may be brought to your room.  For patients admitted to the hospital, checkout time is 11:00 AM the day of discharge.   Patients discharged the day of surgery will not be allowed to drive home.  Name and phone number of your driver: *n/a**  Special Instructions: Incentive Spirometry - Practice and bring it with you on the day of surgery. and CHG Shower Use Special Wash: 1/2 bottle night before surgery and 1/2 bottle morning of surgery.   Please read over the following fact sheets that you were given: Pain Booklet, Coughing and Deep Breathing, Blood Transfusion Information, MRSA Information and Surgical Site Infection Prevention

## 2011-12-15 MED ORDER — VANCOMYCIN HCL 1000 MG IV SOLR
1500.0000 mg | INTRAVENOUS | Status: AC
  Administered 2011-12-16: 1500 mg via INTRAVENOUS
  Filled 2011-12-15: qty 1500

## 2011-12-16 ENCOUNTER — Inpatient Hospital Stay (HOSPITAL_COMMUNITY)
Admission: RE | Admit: 2011-12-16 | Discharge: 2011-12-19 | DRG: 756 | Disposition: A | Discharge status: Home / Self Care | Payer: BC Managed Care – PPO | Source: Ambulatory Visit | Attending: Orthopedic Surgery | Admitting: Orthopedic Surgery

## 2011-12-16 ENCOUNTER — Encounter (HOSPITAL_COMMUNITY): Payer: Self-pay | Admitting: Anesthesiology

## 2011-12-16 ENCOUNTER — Encounter (HOSPITAL_COMMUNITY): Payer: Self-pay | Admitting: *Deleted

## 2011-12-16 ENCOUNTER — Ambulatory Visit (HOSPITAL_COMMUNITY): Payer: BC Managed Care – PPO | Admitting: Anesthesiology

## 2011-12-16 ENCOUNTER — Encounter (HOSPITAL_COMMUNITY)
Admission: RE | Disposition: A | Discharge status: Home / Self Care | Payer: Self-pay | Source: Ambulatory Visit | Attending: Orthopedic Surgery

## 2011-12-16 ENCOUNTER — Ambulatory Visit (HOSPITAL_COMMUNITY): Payer: BC Managed Care – PPO

## 2011-12-16 DIAGNOSIS — M5416 Radiculopathy, lumbar region: Secondary | ICD-10-CM

## 2011-12-16 DIAGNOSIS — K219 Gastro-esophageal reflux disease without esophagitis: Secondary | ICD-10-CM | POA: Diagnosis present

## 2011-12-16 DIAGNOSIS — D509 Iron deficiency anemia, unspecified: Secondary | ICD-10-CM | POA: Diagnosis present

## 2011-12-16 DIAGNOSIS — J45909 Unspecified asthma, uncomplicated: Secondary | ICD-10-CM | POA: Diagnosis present

## 2011-12-16 DIAGNOSIS — E282 Polycystic ovarian syndrome: Secondary | ICD-10-CM | POA: Diagnosis present

## 2011-12-16 DIAGNOSIS — Z6835 Body mass index (BMI) 35.0-35.9, adult: Secondary | ICD-10-CM

## 2011-12-16 DIAGNOSIS — G4730 Sleep apnea, unspecified: Secondary | ICD-10-CM | POA: Diagnosis present

## 2011-12-16 DIAGNOSIS — F3289 Other specified depressive episodes: Secondary | ICD-10-CM | POA: Diagnosis present

## 2011-12-16 DIAGNOSIS — F329 Major depressive disorder, single episode, unspecified: Secondary | ICD-10-CM | POA: Diagnosis present

## 2011-12-16 DIAGNOSIS — M5126 Other intervertebral disc displacement, lumbar region: Principal | ICD-10-CM | POA: Diagnosis present

## 2011-12-16 SURGERY — POSTERIOR LUMBAR FUSION 1 LEVEL
Anesthesia: General | Site: Spine Lumbar | Laterality: Right | Wound class: Clean

## 2011-12-16 MED ORDER — SODIUM CHLORIDE 0.9 % IJ SOLN: 9.0000 mL | INTRAMUSCULAR | Status: DC | PRN

## 2011-12-16 MED ORDER — FENTANYL CITRATE 0.05 MG/ML IJ SOLN
INTRAMUSCULAR | Status: DC | PRN
  Administered 2011-12-16: 100 ug via INTRAVENOUS
  Administered 2011-12-16: 50 ug via INTRAVENOUS
  Administered 2011-12-16: 150 ug via INTRAVENOUS
  Administered 2011-12-16: 50 ug via INTRAVENOUS
  Administered 2011-12-16: 100 ug via INTRAVENOUS
  Administered 2011-12-16: 50 ug via INTRAVENOUS
  Administered 2011-12-16: 100 ug via INTRAVENOUS

## 2011-12-16 MED ORDER — BUPIVACAINE-EPINEPHRINE 0.25% -1:200000 IJ SOLN
INTRAMUSCULAR | Status: DC | PRN
  Administered 2011-12-16: 10 mL

## 2011-12-16 MED ORDER — NALOXONE HCL 0.4 MG/ML IJ SOLN
0.4000 mg | INTRAMUSCULAR | Status: DC | PRN
  Filled 2011-12-16: qty 1

## 2011-12-16 MED ORDER — 0.9 % SODIUM CHLORIDE (POUR BTL) OPTIME
TOPICAL | Status: DC | PRN
  Administered 2011-12-16: 1000 mL

## 2011-12-16 MED ORDER — MENTHOL 3 MG MT LOZG: 1.0000 | LOZENGE | OROMUCOSAL | Status: DC | PRN

## 2011-12-16 MED ORDER — ZOLPIDEM TARTRATE 5 MG PO TABS: 5.0000 mg | ORAL_TABLET | Freq: Every evening | ORAL | Status: DC | PRN

## 2011-12-16 MED ORDER — ACETAMINOPHEN 325 MG PO TABS
650.0000 mg | ORAL_TABLET | ORAL | Status: DC | PRN
  Administered 2011-12-19: 650 mg via ORAL
  Filled 2011-12-16: qty 2

## 2011-12-16 MED ORDER — HYDROMORPHONE HCL PF 1 MG/ML IJ SOLN: 0.2500 mg | INTRAMUSCULAR | Status: DC | PRN

## 2011-12-16 MED ORDER — METFORMIN HCL 500 MG PO TABS
1000.0000 mg | ORAL_TABLET | Freq: Two times a day (BID) | ORAL | Status: DC
  Administered 2011-12-17 – 2011-12-19 (×5): 1000 mg via ORAL
  Filled 2011-12-16 (×7): qty 2

## 2011-12-16 MED ORDER — DIPHENHYDRAMINE HCL 50 MG/ML IJ SOLN
12.5000 mg | Freq: Four times a day (QID) | INTRAMUSCULAR | Status: DC | PRN
  Filled 2011-12-16: qty 0.25

## 2011-12-16 MED ORDER — ACETAMINOPHEN 10 MG/ML IV SOLN
INTRAVENOUS | Status: DC | PRN
  Administered 2011-12-16: 1000 mg via INTRAVENOUS

## 2011-12-16 MED ORDER — HYDROMORPHONE HCL 2 MG PO TABS
1.0000 mg | ORAL_TABLET | ORAL | Status: DC | PRN
  Filled 2011-12-16: qty 1

## 2011-12-16 MED ORDER — HYDROXYZINE HCL 50 MG PO TABS
50.0000 mg | ORAL_TABLET | ORAL | Status: DC | PRN
  Filled 2011-12-16: qty 1

## 2011-12-16 MED ORDER — DROPERIDOL 2.5 MG/ML IJ SOLN
INTRAMUSCULAR | Status: DC | PRN
  Administered 2011-12-16: .625 mg via INTRAVENOUS

## 2011-12-16 MED ORDER — DIAZEPAM 5 MG PO TABS: 5.0000 mg | ORAL_TABLET | Freq: Four times a day (QID) | ORAL | Status: DC | PRN

## 2011-12-16 MED ORDER — PROPOFOL 10 MG/ML IV EMUL
INTRAVENOUS | Status: DC | PRN
  Administered 2011-12-16: 40 ug/kg/min via INTRAVENOUS

## 2011-12-16 MED ORDER — ACETAMINOPHEN 10 MG/ML IV SOLN
INTRAVENOUS | Status: AC
  Filled 2011-12-16: qty 100

## 2011-12-16 MED ORDER — VITAMIN D3 25 MCG (1000 UNIT) PO TABS
2000.0000 [IU] | ORAL_TABLET | Freq: Every day | ORAL | Status: DC
  Administered 2011-12-17 – 2011-12-18 (×2): 2000 [IU] via ORAL
  Filled 2011-12-16 (×3): qty 2

## 2011-12-16 MED ORDER — DIPHENHYDRAMINE HCL 12.5 MG/5ML PO ELIX
12.5000 mg | ORAL_SOLUTION | Freq: Four times a day (QID) | ORAL | Status: DC | PRN
  Filled 2011-12-16: qty 5

## 2011-12-16 MED ORDER — FLUTICASONE PROPIONATE HFA 44 MCG/ACT IN AERO
2.0000 | INHALATION_SPRAY | Freq: Two times a day (BID) | RESPIRATORY_TRACT | Status: DC
  Administered 2011-12-17 – 2011-12-18 (×4): 2 via RESPIRATORY_TRACT
  Filled 2011-12-16: qty 10.6

## 2011-12-16 MED ORDER — POTASSIUM CHLORIDE IN NACL 20-0.9 MEQ/L-% IV SOLN
INTRAVENOUS | Status: DC
  Administered 2011-12-16 – 2011-12-18 (×2): via INTRAVENOUS
  Filled 2011-12-16 (×6): qty 1000

## 2011-12-16 MED ORDER — SCOPOLAMINE 1 MG/3DAYS TD PT72
MEDICATED_PATCH | TRANSDERMAL | Status: DC | PRN
  Administered 2011-12-16: 1 via TRANSDERMAL

## 2011-12-16 MED ORDER — PANTOPRAZOLE SODIUM 40 MG PO TBEC
40.0000 mg | DELAYED_RELEASE_TABLET | Freq: Every day | ORAL | Status: DC
  Administered 2011-12-17 – 2011-12-18 (×2): 40 mg via ORAL
  Filled 2011-12-16 (×4): qty 1

## 2011-12-16 MED ORDER — FLUTICASONE PROPIONATE 50 MCG/ACT NA SUSP
2.0000 | Freq: Every day | NASAL | Status: DC
  Administered 2011-12-17 – 2011-12-18 (×2): 2 via NASAL
  Filled 2011-12-16: qty 16

## 2011-12-16 MED ORDER — ONDANSETRON HCL 4 MG/2ML IJ SOLN: 4.0000 mg | Freq: Once | INTRAMUSCULAR | Status: DC | PRN

## 2011-12-16 MED ORDER — LACTATED RINGERS IV SOLN
INTRAVENOUS | Status: DC | PRN
  Administered 2011-12-16: 13:00:00 via INTRAVENOUS

## 2011-12-16 MED ORDER — HEMOSTATIC AGENTS (NO CHARGE) OPTIME
TOPICAL | Status: DC | PRN
  Administered 2011-12-16: 1 via TOPICAL

## 2011-12-16 MED ORDER — VANCOMYCIN HCL IN DEXTROSE 1-5 GM/200ML-% IV SOLN
1000.0000 mg | Freq: Two times a day (BID) | INTRAVENOUS | Status: DC
  Administered 2011-12-17 – 2011-12-18 (×4): 1000 mg via INTRAVENOUS
  Filled 2011-12-16 (×6): qty 200

## 2011-12-16 MED ORDER — SODIUM CHLORIDE 0.9 % IV SOLN: 250.0000 mL | INTRAVENOUS | Status: DC

## 2011-12-16 MED ORDER — THROMBIN 20000 UNITS EX KIT: PACK | CUTANEOUS | Status: DC | PRN

## 2011-12-16 MED ORDER — HYDROXYZINE HCL 50 MG/ML IM SOLN: 50.0000 mg | INTRAMUSCULAR | Status: DC | PRN

## 2011-12-16 MED ORDER — LACTATED RINGERS IV SOLN
INTRAVENOUS | Status: DC
  Administered 2011-12-16 (×3): via INTRAVENOUS

## 2011-12-16 MED ORDER — DULOXETINE HCL 60 MG PO CPEP
90.0000 mg | ORAL_CAPSULE | Freq: Every day | ORAL | Status: DC
  Administered 2011-12-17 – 2011-12-19 (×3): 90 mg via ORAL
  Filled 2011-12-16 (×3): qty 1

## 2011-12-16 MED ORDER — VANCOMYCIN HCL 1000 MG IV SOLR
1500.0000 mg | Freq: Once | INTRAVENOUS | Status: AC
  Administered 2011-12-16: 1500 mg via INTRAVENOUS
  Filled 2011-12-16: qty 1500

## 2011-12-16 MED ORDER — ROCURONIUM BROMIDE 100 MG/10ML IV SOLN
INTRAVENOUS | Status: DC | PRN
  Administered 2011-12-16: 50 mg via INTRAVENOUS

## 2011-12-16 MED ORDER — PROPOFOL 10 MG/ML IV EMUL
INTRAVENOUS | Status: DC | PRN
  Administered 2011-12-16: 200 mg via INTRAVENOUS

## 2011-12-16 MED ORDER — POVIDONE-IODINE 7.5 % EX SOLN
Freq: Once | CUTANEOUS | Status: DC
  Filled 2011-12-16: qty 118

## 2011-12-16 MED ORDER — ACETAMINOPHEN 650 MG RE SUPP: 650.0000 mg | RECTAL | Status: DC | PRN

## 2011-12-16 MED ORDER — PHENOL 1.4 % MT LIQD
1.0000 | OROMUCOSAL | Status: DC | PRN
  Filled 2011-12-16: qty 177

## 2011-12-16 MED ORDER — SODIUM CHLORIDE 0.9 % IJ SOLN: 3.0000 mL | INTRAMUSCULAR | Status: DC | PRN

## 2011-12-16 MED ORDER — FERROUS SULFATE 325 (65 FE) MG PO TABS
325.0000 mg | ORAL_TABLET | Freq: Every day | ORAL | Status: DC
  Administered 2011-12-17 – 2011-12-18 (×2): 325 mg via ORAL
  Filled 2011-12-16 (×3): qty 1

## 2011-12-16 MED ORDER — THROMBIN 20000 UNITS EX KIT
PACK | OROMUCOSAL | Status: DC | PRN
  Administered 2011-12-16: 14:00:00 via TOPICAL

## 2011-12-16 MED ORDER — ONDANSETRON HCL 4 MG/2ML IJ SOLN
4.0000 mg | Freq: Four times a day (QID) | INTRAMUSCULAR | Status: DC | PRN
  Filled 2011-12-16: qty 2

## 2011-12-16 MED ORDER — HYDROMORPHONE 0.3 MG/ML IV SOLN
INTRAVENOUS | Status: DC
  Administered 2011-12-16: 7.5 mg via INTRAVENOUS
  Administered 2011-12-17: 0.799 mg via INTRAVENOUS
  Administered 2011-12-17: 1.39 mg via INTRAVENOUS

## 2011-12-16 MED ORDER — HYDROMORPHONE HCL PF 1 MG/ML IJ SOLN: 0.5000 mg | INTRAMUSCULAR | Status: DC | PRN

## 2011-12-16 MED ORDER — SODIUM CHLORIDE 0.9 % IJ SOLN
3.0000 mL | Freq: Two times a day (BID) | INTRAMUSCULAR | Status: DC
  Administered 2011-12-17 – 2011-12-18 (×2): 3 mL via INTRAVENOUS

## 2011-12-16 MED ORDER — MEPERIDINE HCL 25 MG/ML IJ SOLN: 6.2500 mg | INTRAMUSCULAR | Status: DC | PRN

## 2011-12-16 MED ORDER — THERA M PLUS PO TABS
1.0000 | ORAL_TABLET | Freq: Every day | ORAL | Status: DC
  Administered 2011-12-17 – 2011-12-18 (×2): 1 via ORAL
  Filled 2011-12-16 (×3): qty 1

## 2011-12-16 MED ORDER — ONDANSETRON HCL 4 MG/2ML IJ SOLN
INTRAMUSCULAR | Status: DC | PRN
  Administered 2011-12-16: 4 mg via INTRAVENOUS

## 2011-12-16 MED ORDER — MIDAZOLAM HCL 5 MG/5ML IJ SOLN
INTRAMUSCULAR | Status: DC | PRN
  Administered 2011-12-16: 2 mg via INTRAVENOUS

## 2011-12-16 MED ORDER — DEXAMETHASONE SODIUM PHOSPHATE 4 MG/ML IJ SOLN
INTRAMUSCULAR | Status: DC | PRN
  Administered 2011-12-16: 8 mg via INTRAVENOUS

## 2011-12-16 MED ORDER — SODIUM CHLORIDE 0.9 % IV SOLN
INTRAVENOUS | Status: DC | PRN
  Administered 2011-12-16: 18:00:00 via INTRAVENOUS

## 2011-12-16 MED ORDER — VANCOMYCIN HCL IN DEXTROSE 1-5 GM/200ML-% IV SOLN
1000.0000 mg | Freq: Two times a day (BID) | INTRAVENOUS | Status: DC
  Filled 2011-12-16: qty 200

## 2011-12-16 SURGICAL SUPPLY — 75 items
APL SKNCLS STERI-STRIP NONHPOA (GAUZE/BANDAGES/DRESSINGS)
BENZOIN TINCTURE PRP APPL 2/3 (GAUZE/BANDAGES/DRESSINGS) IMPLANT
BLADE SURG ROTATE 9660 (MISCELLANEOUS) IMPLANT
BUR ROUND PRECISION 4.0 (BURR) ×2 IMPLANT
CAGE BULLET CONCORDE 9X9X27 (Cage) ×1 IMPLANT
CARTRIDGE OIL MAESTRO DRILL (MISCELLANEOUS) IMPLANT
CLOTH BEACON ORANGE TIMEOUT ST (SAFETY) ×2 IMPLANT
CONT SPEC STER OR (MISCELLANEOUS) ×2 IMPLANT
CORDS BIPOLAR (ELECTRODE) ×2 IMPLANT
COVER SURGICAL LIGHT HANDLE (MISCELLANEOUS) ×2 IMPLANT
DIFFUSER DRILL AIR PNEUMATIC (MISCELLANEOUS) IMPLANT
DRAIN CHANNEL 15F RND FF W/TCR (WOUND CARE) IMPLANT
DRAPE C-ARM 42X72 X-RAY (DRAPES) ×2 IMPLANT
DRAPE ORTHO SPLIT 77X108 STRL (DRAPES) ×2
DRAPE POUCH INSTRU U-SHP 10X18 (DRAPES) ×2 IMPLANT
DRAPE SURG 17X23 STRL (DRAPES) ×6 IMPLANT
DRAPE SURG ORHT 6 SPLT 77X108 (DRAPES) ×1 IMPLANT
DRSG TEGADERM 8X12 (GAUZE/BANDAGES/DRESSINGS) ×1 IMPLANT
DURAPREP 26ML APPLICATOR (WOUND CARE) ×2 IMPLANT
ELECT BLADE 4.0 EZ CLEAN MEGAD (MISCELLANEOUS) ×2
ELECT CAUTERY BLADE 6.4 (BLADE) ×3 IMPLANT
ELECT REM PT RETURN 9FT ADLT (ELECTROSURGICAL) ×2
ELECTRODE BLDE 4.0 EZ CLN MEGD (MISCELLANEOUS) ×1 IMPLANT
ELECTRODE REM PT RTRN 9FT ADLT (ELECTROSURGICAL) ×1 IMPLANT
EVACUATOR SILICONE 100CC (DRAIN) IMPLANT
GAUZE SPONGE 4X4 16PLY XRAY LF (GAUZE/BANDAGES/DRESSINGS) ×8 IMPLANT
GLOVE BIO SURGEON STRL SZ8 (GLOVE) ×2 IMPLANT
GLOVE BIOGEL PI IND STRL 8 (GLOVE) ×1 IMPLANT
GLOVE BIOGEL PI INDICATOR 8 (GLOVE) ×1
GOWN PREVENTION PLUS XLARGE (GOWN DISPOSABLE) ×2 IMPLANT
GOWN STRL NON-REIN LRG LVL3 (GOWN DISPOSABLE) ×4 IMPLANT
IV CATH 14GX2 1/4 (CATHETERS) ×2 IMPLANT
KIT BASIN OR (CUSTOM PROCEDURE TRAY) ×2 IMPLANT
KIT POSITION SURG JACKSON T1 (MISCELLANEOUS) ×2 IMPLANT
KIT ROOM TURNOVER OR (KITS) ×2 IMPLANT
MARKER SKIN DUAL TIP RULER LAB (MISCELLANEOUS) ×2 IMPLANT
NDL HYPO 25GX1X1/2 BEV (NEEDLE) ×1 IMPLANT
NDL SPNL 18GX3.5 QUINCKE PK (NEEDLE) ×2 IMPLANT
NEEDLE BONE MARROW 8GX6 FENEST (NEEDLE) ×1 IMPLANT
NEEDLE HYPO 25GX1X1/2 BEV (NEEDLE) ×2 IMPLANT
NEEDLE SPNL 18GX3.5 QUINCKE PK (NEEDLE) ×4 IMPLANT
NS IRRIG 1000ML POUR BTL (IV SOLUTION) ×2 IMPLANT
OIL CARTRIDGE MAESTRO DRILL (MISCELLANEOUS)
PACK LAMINECTOMY ORTHO (CUSTOM PROCEDURE TRAY) ×2 IMPLANT
PACK UNIVERSAL I (CUSTOM PROCEDURE TRAY) ×2 IMPLANT
PACK VITOSS BIOACTIVE 10CC (Neuro Prosthesis/Implant) ×1 IMPLANT
PAD ARMBOARD 7.5X6 YLW CONV (MISCELLANEOUS) ×4 IMPLANT
PATTIES SURGICAL .5 X1 (DISPOSABLE) ×2 IMPLANT
PATTIES SURGICAL .5X1.5 (GAUZE/BANDAGES/DRESSINGS) ×2 IMPLANT
SCREW EXPEDIUM POLYAXIAL 7X40M (Screw) ×2 IMPLANT
SCREW POLYAXIAL 7X45MM (Screw) ×2 IMPLANT
SCREW SET SINGLE INNER (Screw) ×6 IMPLANT
SPONGE GAUZE 4X4 12PLY (GAUZE/BANDAGES/DRESSINGS) ×2 IMPLANT
SPONGE INTESTINAL PEANUT (DISPOSABLE) ×2 IMPLANT
SPONGE SURGIFOAM ABS GEL 100 (HEMOSTASIS) ×2 IMPLANT
STRIP CLOSURE SKIN 1/2X4 (GAUZE/BANDAGES/DRESSINGS) ×1 IMPLANT
SURGIFLO TRUKIT (HEMOSTASIS) IMPLANT
SUT MNCRL AB 3-0 PS2 18 (SUTURE) ×2 IMPLANT
SUT MNCRL AB 4-0 PS2 18 (SUTURE) ×2 IMPLANT
SUT VIC AB 0 CT1 18XCR BRD 8 (SUTURE) ×1 IMPLANT
SUT VIC AB 0 CT1 8-18 (SUTURE) ×2
SUT VIC AB 1 CT1 18XCR BRD 8 (SUTURE) ×2 IMPLANT
SUT VIC AB 1 CT1 8-18 (SUTURE) ×4
SUT VIC AB 2-0 CT2 18 VCP726D (SUTURE) ×2 IMPLANT
SYR 20CC LL (SYRINGE) ×2 IMPLANT
SYR BULB IRRIGATION 50ML (SYRINGE) ×2 IMPLANT
SYR CONTROL 10ML LL (SYRINGE) ×4 IMPLANT
TAPE CLOTH SURG 4X10 WHT LF (GAUZE/BANDAGES/DRESSINGS) ×1 IMPLANT
TOWEL OR 17X24 6PK STRL BLUE (TOWEL DISPOSABLE) ×2 IMPLANT
TOWEL OR 17X26 10 PK STRL BLUE (TOWEL DISPOSABLE) ×2 IMPLANT
TRAY FOLEY CATH 14FR (SET/KITS/TRAYS/PACK) ×2 IMPLANT
WATER STERILE IRR 1000ML POUR (IV SOLUTION) ×2 IMPLANT
YANKAUER SUCT BULB TIP NO VENT (SUCTIONS) ×2 IMPLANT
expedium 5.5 polyaxial screw 8x30mm ×2 IMPLANT
expedium 5.5 pre-bent rod 65mm ×2 IMPLANT

## 2011-12-16 NOTE — Transfer of Care (Signed)
Immediate Anesthesia Transfer of Care Note  Patient: Adriana Newman  Procedure(s) Performed:  POSTERIOR LUMBAR FUSION 1 LEVEL - Right sided lumbar 4-5 transforaminal lumbar interbody fusion with revision, and instrumentation.  Patient Location: PACU  Anesthesia Type: General  Level of Consciousness: sedated  Airway & Oxygen Therapy: Patient Spontanous Breathing and Patient connected to face mask oxygen  Post-op Assessment: Report given to PACU RN and Post -op Vital signs reviewed and stable  Post vital signs: Reviewed and stable  Complications: No apparent anesthesia complications

## 2011-12-16 NOTE — Anesthesia Procedure Notes (Signed)
Procedure Name: Intubation Date/Time: 12/16/2011 1:03 PM Performed by: Gwenyth Allegra Pre-anesthesia Checklist: Patient identified, Timeout performed, Emergency Drugs available, Suction available and Patient being monitored Patient Re-evaluated:Patient Re-evaluated prior to inductionOxygen Delivery Method: Circle System Utilized Preoxygenation: Pre-oxygenation with 100% oxygen Intubation Type: IV induction Ventilation: Mask ventilation without difficulty Grade View: Grade II Tube type: Oral Tube size: 7.5 mm Number of attempts: 1 Placement Confirmation: ETT inserted through vocal cords under direct vision,  positive ETCO2 and breath sounds checked- equal and bilateral Secured at: 22 cm Tube secured with: Tape Dental Injury: Teeth and Oropharynx as per pre-operative assessment

## 2011-12-16 NOTE — Progress Notes (Signed)
PHARMACY - ANTIBIOTIC CONSULT  INITIAL NOTE  Pharmacy Consult for: Vancomycin  Indication: Surgical prophylaxis   Patient Data:   Allergies: Allergies  Allergen Reactions  . Adhesive (Tape) Itching    Band aids cause redness , Swelling and itching  . Morphine And Related Other (See Comments)    Pt states Morphine does not relieve pain  . Penicillins Hives  . Percocet (Oxycodone-Acetaminophen) Itching    Patient Measurements: Height: 5\' 7"  (170.2 cm) Weight: 229 lb (103.874 kg) IBW/kg (Calculated) : 61.6  Adjusted Body Weight:   Vital Signs: Temp:  [97.6 F (36.4 C)-98 F (36.7 C)] 97.8 F (36.6 C) (12/26 2115) Pulse Rate:  [79-120] 104  (12/26 2132) Resp:  [13-20] 18  (12/26 2132) BP: (116-138)/(63-84) 132/82 mmHg (12/26 2131) SpO2:  [93 %-100 %] 93 % (12/26 2132) Weight:  [229 lb (103.874 kg)] 229 lb (103.874 kg) (12/26 2017)  Intake/Output from previous day:  Intake/Output Summary (Last 24 hours) at 12/16/11 2217 Last data filed at 12/16/11 2105  Gross per 24 hour  Intake   4265 ml  Output   1850 ml  Net   2415 ml    Labs: No results found for this basename: WBC:3,HGB:3,PLT:3,LABCREA:3,CREATININE:3 in the last 72 hours Estimated Creatinine Clearance: 114.7 ml/min (by C-G formula based on Cr of 0.72). No results found for this basename: VANCOTROUGH:2,VANCOPEAK:2,VANCORANDOM:2,GENTTROUGH:2,GENTPEAK:2,GENTRANDOM:2,TOBRATROUGH:2,TOBRAPEAK:2,TOBRARND:2,AMIKACINPEAK:2,AMIKACINTROU:2,AMIKACIN:2, in the last 72 hours   Microbiology: Recent Results (from the past 720 hour(s))  SURGICAL PCR SCREEN     Status: Normal   Collection Time   12/11/11  3:27 PM      Component Value Range Status Comment   MRSA, PCR NEGATIVE  NEGATIVE  Final    Staphylococcus aureus NEGATIVE  NEGATIVE  Final     Medical History: Past Medical History  Diagnosis Date  . PONV (postoperative nausea and vomiting)     prefers to have scope patch  . Asthma     allergy induced  . Sleep apnea  2012    uses cpap  . Anemia     iron deficiency  . GERD (gastroesophageal reflux disease)   . Depression   . Polycystic ovarian disease     takes Metformin    Scheduled Medications:     . DULoxetine  90 mg Oral Daily  . fluticasone  2 spray Each Nare Daily  . fluticasone  2 puff Inhalation BID  . HYDROmorphone PCA 0.3 mg/mL   Intravenous Q4H  . Iron  1 tablet Oral Daily  . metFORMIN  1,000 mg Oral BID WC  . multivitamins ther. w/minerals  1 tablet Oral Daily  . pantoprazole  40 mg Oral Q1200  . sodium chloride  3 mL Intravenous Q12H  . vancomycin  1,500 mg Intravenous 60 min Pre-Op  . Vitamin D  1 capsule Oral Daily  . DISCONTD: povidone-iodine   Topical Once      Assessment:  40 y.o. female admitted on 12/16/2011 s/p spinal surgery with drain placement. Pharmacy consulted to manage vancomycin. Patient received 1.5gm IV vancomycin at 12:45.    Goal of Therapy:  1. Vancomycin trough level 15-20 mcg/ml  Plan:  1. Vancomycin 1.5gm IV x 1, and then 1gm IV Q12H.  2. Will order vancomycin troughs when indicated.    Dineen Kid Thad Ranger, PharmD 12/16/2011, 10:17 PM

## 2011-12-16 NOTE — Anesthesia Preprocedure Evaluation (Addendum)
Anesthesia Evaluation  Patient identified by MRN, date of birth, ID band Patient awake    Reviewed: Allergy & Precautions, H&P , NPO status , Patient's Chart, lab work & pertinent test results  History of Anesthesia Complications (+) PONVHistory of anesthetic complications: Scopolamine patch worked in the past.  Airway Mallampati: I TM Distance: >3 FB Neck ROM: Full    Dental No notable dental hx. (+) Teeth Intact and Dental Advisory Given   Pulmonary asthma (Uses Inhaler) , sleep apnea and Continuous Positive Airway Pressure Ventilation ,  clear to auscultation  Pulmonary exam normal       Cardiovascular Regular Normal    Neuro/Psych    GI/Hepatic GERD-  Medicated and Controlled,  Endo/Other  Morbid obesity  Renal/GU      Musculoskeletal   Abdominal   Peds  Hematology   Anesthesia Other Findings   Reproductive/Obstetrics                        Anesthesia Physical Anesthesia Plan  ASA: II  Anesthesia Plan: General   Post-op Pain Management:    Induction: Intravenous  Airway Management Planned: Oral ETT  Additional Equipment:   Intra-op Plan:   Post-operative Plan: Extubation in OR  Informed Consent: I have reviewed the patients History and Physical, chart, labs and discussed the procedure including the risks, benefits and alternatives for the proposed anesthesia with the patient or authorized representative who has indicated his/her understanding and acceptance.   Dental advisory given  Plan Discussed with: CRNA, Anesthesiologist and Surgeon  Anesthesia Plan Comments:         Anesthesia Quick Evaluation

## 2011-12-16 NOTE — Anesthesia Postprocedure Evaluation (Signed)
  Anesthesia Post-op Note  Patient: Adriana Newman  Procedure(s) Performed:  POSTERIOR LUMBAR FUSION 1 LEVEL - Right sided lumbar 4-5 transforaminal lumbar interbody fusion with revision, and instrumentation.  Patient Location: PACU  Anesthesia Type: General  Level of Consciousness: awake, oriented, sedated and patient cooperative  Airway and Oxygen Therapy: Patient Spontanous Breathing and Patient connected to nasal cannula oxygen  Post-op Pain: moderate  Post-op Assessment: Post-op Vital signs reviewed, Patient's Cardiovascular Status Stable, Respiratory Function Stable, Patent Airway, No signs of Nausea or vomiting and Pain level controlled  Post-op Vital Signs: stable  Complications: No apparent anesthesia complications

## 2011-12-16 NOTE — Preoperative (Signed)
Beta Blockers   Reason not to administer Beta Blockers:Not Applicable 

## 2011-12-16 NOTE — H&P (Signed)
PREOPERATIVE H&P  Chief Complaint: right leg pain  HPI: Adriana Newman is a 40 y.o. female who presents with right leg pain   Past Medical History  Diagnosis Date  . PONV (postoperative nausea and vomiting)     prefers to have scope patch  . Asthma     allergy induced  . Sleep apnea 2012    uses cpap  . Anemia     iron deficiency  . GERD (gastroesophageal reflux disease)   . Depression   . Polycystic ovarian disease     takes Metformin   Past Surgical History  Procedure Date  . Back surgery 2002    L5-S1 fusion  . Knee arthroscopy w/ acl reconstruction [id number]    bilateral  . Breast surgery 2010    reduction  . Cesarean section 2006   History   Social History  . Marital Status: Married    Spouse Name: N/A    Number of Children: N/A  . Years of Education: N/A   Social History Main Topics  . Smoking status: Never Smoker   . Smokeless tobacco: Never Used  . Alcohol Use: No  . Drug Use: No  . Sexually Active: Yes    Birth Control/ Protection: None   Other Topics Concern  . None   Social History Narrative  . None   History reviewed. No pertinent family history. Allergies  Allergen Reactions  . Adhesive (Tape) Itching    Band aids cause redness , Swelling and itching  . Morphine And Related Other (See Comments)    Pt states Morphine does not relieve pain  . Penicillins Hives  . Percocet (Oxycodone-Acetaminophen) Itching   Prior to Admission medications   Medication Sig Start Date End Date Taking? Authorizing Provider  budesonide (PULMICORT FLEXHALER) 180 MCG/ACT inhaler Inhale 1 puff into the lungs 2 (two) times daily.     Yes Historical Provider, MD  CALCIUM PO Take 1 tablet by mouth daily.     Yes Historical Provider, MD  Cholecalciferol (VITAMIN D) 2000 UNITS CAPS Take 1 capsule by mouth daily.     Yes Historical Provider, MD  DULoxetine (CYMBALTA) 30 MG capsule Take 90 mg by mouth daily.     Yes Historical Provider, MD  Ferrous Sulfate (IRON)  325 (65 FE) MG TABS Take 1 tablet by mouth daily.     Yes Historical Provider, MD  fluticasone (FLONASE) 50 MCG/ACT nasal spray Place 2 sprays into the nose daily.     Yes Historical Provider, MD  HYDROcodone-acetaminophen (NORCO) 10-325 MG per tablet Take 1 tablet by mouth every 6 (six) hours as needed.     Yes Historical Provider, MD  metFORMIN (GLUCOPHAGE) 1000 MG tablet Take 1,000 mg by mouth 2 (two) times daily with a meal.     Yes Historical Provider, MD  Multiple Vitamins-Minerals (MULTIVITAMINS THER. W/MINERALS) TABS Take 1 tablet by mouth daily.     Yes Historical Provider, MD  Omeprazole Magnesium (PRILOSEC OTC PO) Take 1 tablet by mouth daily.     Yes Historical Provider, MD     All other systems have been reviewed and were otherwise negative with the exception of those mentioned in the HPI and as above.  Physical Exam: Filed Vitals:   12/16/11 1021  BP: 116/76  Pulse: 79  Temp: 98 F (36.7 C)  Resp: 18    General: Alert, no acute distress Cardiovascular: No pedal edema Respiratory: No cyanosis, no use of accessory musculature GI: No organomegaly, abdomen is soft  and non-tender Skin: No lesions in the area of chief complaint Neurologic: Sensation intact distally Psychiatric: Patient is competent for consent with normal mood and affect Lymphatic: No axillary or cervical lymphadenopathy  MUSCULOSKELETAL: 4/5 EHL and dorsiflexion on the right, 5/5 on left  Assessment/Plan: Right leg pain Plan for Procedure(s): Right L4/5 TLIF with revision instrumentation   Lindalee Huizinga LEONARD 12/16/2011 12:16 PM

## 2011-12-17 LAB — CBC
HCT: 34.3 % — ABNORMAL LOW (ref 36.0–46.0)
Hemoglobin: 11.3 g/dL — ABNORMAL LOW (ref 12.0–15.0)
MCV: 93.7 fL (ref 78.0–100.0)
RBC: 3.66 MIL/uL — ABNORMAL LOW (ref 3.87–5.11)
WBC: 8.5 10*3/uL (ref 4.0–10.5)

## 2011-12-17 MED ORDER — HYDROMORPHONE HCL 2 MG PO TABS: 2.0000 mg | ORAL_TABLET | ORAL | Status: DC | PRN

## 2011-12-17 MED ORDER — HYDROCODONE-ACETAMINOPHEN 5-325 MG PO TABS
1.0000 | ORAL_TABLET | ORAL | Status: DC | PRN
  Administered 2011-12-17: 1 via ORAL
  Administered 2011-12-17 (×2): 2 via ORAL
  Administered 2011-12-17: 1 via ORAL
  Administered 2011-12-18 – 2011-12-19 (×4): 2 via ORAL
  Filled 2011-12-17: qty 1
  Filled 2011-12-17 (×2): qty 2
  Filled 2011-12-17: qty 1
  Filled 2011-12-17 (×4): qty 2

## 2011-12-17 NOTE — Progress Notes (Signed)
Pt reports minimal back pain. Right leg pain resolved.  AVSS Dressing CDI Drain in place Weakness in left foot slightly improved.  Drain output: 50  POD #1 after revision decompression and fusion from L4-S1  - up with PT today with brace - maintain drain - dilaudid for pain, valium for spasms - d/c PCA

## 2011-12-17 NOTE — Op Note (Signed)
NAMENATALEIGH, GRIFFIN NO.:  [id number]  MEDICAL RECORD NO.:  [id number]  LOCATION:  5011                         FACILITY:  [facility]  PHYSICIAN:  Estill Bamberg, MD      DATE OF BIRTH:  July 20, 1971  DATE OF PROCEDURE:  12/16/2011 DATE OF DISCHARGE:                              OPERATIVE REPORT   PREOPERATIVE DIAGNOSES: 1. Right-sided L4-L5 radiculopathy. 2. Right-sided posterolateral L4-5 disk extrusion, causing compression     of the right-sided L4 and L5 nerves 3. 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  POSTOPERATIVE DIAGNOSES: 1. Right-sided L4-L5 radiculopathy. 2. Right-sided posterolateral L4-5     disk extrusion, causing compression of the right-sided L4 and L5     nerves 2. 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  PROCEDURES: 1. Right-sided transforaminal lumbar interbody fusion, L4-5. 2. Left-sided posterolateral fusion, L4-5. 3. Insertion of L4-5 interbody device (Concorde bulleted parallel 27     mm x 9 mm cage). 4. Placement of posterior instrumentation, L4, L5. 5. Removal and reinsertion of instrumentation, L5, S1. 6. Use of local autograft. 7. Intraoperative bone marrow aspiration from the patient's left iliac     crest using a separate incision. 8. Exploration of previous spinal fusion. 9. Intraoperative use of fluoroscopy.  SURGEON:  Estill Bamberg, MD  ASSISTANT:  Janace Litten, OPA  ANESTHESIA:  General endotracheal anesthesia.  COMPLICATIONS:  None.  DISPOSITION:  Stable.  ESTIMATED BLOOD LOSS:  600 mL.  INSTRUMENTATION USED:  Expedium instrumentation (7 x 45 mm screws at L4, 7 x 40 mm screws at L5, 8 x3 0 mm screws at S1).  INDICATIONS FOR PROCEDURE:  Briefly, Ms. Feggins is a very pleasant 40- year-old female who initially presented to me on December 04, 2011, with severe and debilitating pain in her right leg.  Of note, the patient is 10 years status post an L5-S1 decompression and fusion.  The patient did well from  that procedure, but did go on to have rather severe and debilitating pain in the right leg.  I did review an MRI which was notable for an obvious extruded L4-5 disk fragment, located in the foramen at the L4-5 level and located in the lateral recess behind the L4 vertebral body, causing compression of the L4 and L5 nerves.  We did go forward with extensive discussion regarding additional nonoperative measures versus going forward with surgery.  The patient was very much against going forward with additional conservative care, and she was interested in going ahead with a lumbar decompression and fusion.  The patient fully understood the risks and limitations of the procedure as outlined in my preoperative note.  Of note, the patient did have 4/5 strength at her EHL and dorsiflexion preoperatively, and she did understand that regaining her strength would take time.  OPERATIVE DETAILS:  On December 16, 2011, the patient was brought to surgery and general endotracheal anesthesia was administered.  The patient was placed prone on a well-padded Jackson spinal table with a Wilson frame.  All bony prominences were meticulously padded.  SCDs were placed and antibiotics were given.  I then made an incision overlying the previous L5-S1 incision and that was extended superiorly.  The fascia was  readily identified and the fascia was incised using electrocautery.  The paraspinal musculature was bluntly swept laterally. The L5 and S1 instrumentation that was previously placed was readily identified.  A Monarch instrumentation was previously used.  A Monarch instrumentation was removed uneventfully.  I did use a ball-tipped probe to confirm that there was no cortical violation of the previously placed screws.  I then cannulated the L4 pedicles on both the right and the left sides in the usual manner.  A 4-mm burr followed by a 6-mm tap was utilized followed by a ball-tipped probe to confirm that there  was no cortical violation.  The cannulated L4 pedicles were sealed using bone wax.  I then turned my attention towards the L4-5 level on the right side.  There was extensive scar tissue noted.  There was a very abundant amount of scar tissue identified about the posterior elements at the L4- 5 level.  I did use a series of curettes to meticulously tease away the scar tissue and around the lamina of L4.  Of note, a Gill laminectomy was performed previously and there were no posterior elements in continuity at the L5 level.  Therefore, removing the inferior articular process and the lamina of L4 was extremely meticulous and did take an extensive amount of care in order to ensure that a durotomy was not encountered.  I did ultimately gain access to the L4-5 intervertebral space.  Of note, the exposure aspect of this portion of the procedure normally takes approximately 20-30 minutes.  In this situation, it took approximately 60-90 minutes, given the extensive an abundant scar tissue noted about the L4 lamina secondary to the previous decompression performed.  There was noted to be extensive epidural bleeding and the epidural bleeding was meticulously controlled using bipolar electrocautery.  I did go forward with exploring the epidural space at the L4-5 level.  I did use a Penfield 4 to explore the space above the exiting L4 nerve.  A very large disk fragment was readily identified and was located immediately beneath the exiting L4 nerve in the lateral recess beneath the traversing L5 nerve.  This was removed in 1 large fragment.  I then had an assistant hold medial retraction on the traversing L5 nerve root.  I used a 15 blade knife to perform an annulotomy at the L4-5 level.  I then used a series of curettes and scrapers and pituitary rongeur in order to perform a thorough and complete diskectomy.  I was very happy with the diskectomy performed.  I then placed a series of interbody trials  and I did feel that a 9-mm trial would be the most appropriate fit.  I then made a separate incision over the patient's left iliac crest, then a total of 7 mL of bone marrow aspirate was obtained from the patient's left iliac crest. This was mixed with 10 mL of Vitoss BA and this was mixed with autograft obtained from removing the facet joint at the L4-5 level.  The interbody spaces was then thoroughly packed with the Vitoss/bone marrow aspirate/autograft mixture.  The interbody device was also packed and the interbody device was tamped into position in the usual fashion.  I did ensure that there was no undue trauma to the exiting L4 nerve or the traversing L5 nerve.  I was very happy with the final press fit of the implant.  I then went forward with placing L4, L5, and S1 screws on the right side.  A 65-mm rod was  secured into the screws and caps were placed followed by a final locking procedure.  Of note, prior to placing the screws, I did explore thoroughly the L4-5 space to ensure that an adequate fusion was obtained.  The fusion did appear to be robust incomplete.  I then copiously irrigated the wound with approximately 1 L of normal saline.  I then turned my attention towards the patient's left side.  I subperiosteally exposed the transverse process of L4 and L5 in addition to the fusion mass up to the L5 level.  I then used a 4-mm high- speed burr to thoroughly decorticate the fusion mass in addition to the posterior elements of L4 in addition to the transverse process of L4 and L5.  I then packed the remainder of the bone graft mixture into the posterior elements and into the posterolateral gutter.  L4, L5, and S1 screws were again placed.  Of note, I did use neurologic monitoring throughout the surgery and I did also use triggered EMG and there was no triggered EMG response at any of the screws at anything less than 20 milliamps.  I then again placed a 65-mm rod, and caps were  then placed and a final locking procedure was performed.  I did liberally use both AP and lateral fluoroscopy in order to confirm appropriate positioning of the screws.  At this point of the procedure, I did place a #15 Blake drain overlying the right epidural space in the posterolateral elements on the right side.  I then closed the fascia using #1 Vicryl.  Deep subcutaneous layer was closed using 0 Vicryl and the subcutaneous layer was closed using 2-0 Vicryl.  The skin was closed using 3-0 Monocryl. All instrument counts were correct at the termination of the procedure. Of note, Janace Litten, was my assistant throughout the procedure and aided in essential retraction and suctioning required throughout the surgery.     Estill Bamberg, MD     MD/MEDQ  D:  12/16/2011  T:  12/17/2011  Job:  [id number]

## 2011-12-17 NOTE — Progress Notes (Addendum)
Pt is s/p PLIF L4-L5 as per rept. Pt is alert and oriented x 3.  Pt has a JP to her back incision that is moderate amount of bldy drainage. Pt has a dry gauze dressing to back. Pt repts that preop she had R leg pain, numbness and tingling that she repts is "better" to gone postop. Neuro check is negative except for slight weakness of RLE with resistance. No other numbness or tingling noted. Pt has brace to bedside to get up with PT this AM. Pt has a scopalamine patch behind L ear due to a history of postop nausea and vomiting as per order. Pt repts LBM 12/25. Pt denies nausea or vomiting or passing gas. Pt has tolerated ice chips and sips during the night and will try clear liquids with breakfast and advance with lunch per MD order. Pt BS are faint to nonexistent. Abdomen is soft flat and nontender. Pt pain is "good" as per pt admission with IV pain PCA per order. Lungs CTA but noted to be diminished in the bases. Pt sats 97% 2lpm. Pt repts nonprod cough and pt performs IS per order. Will obtain order for home CPAP use this AM and will notify resp accordingly.

## 2011-12-17 NOTE — Progress Notes (Signed)
Physical Therapy Evaluation Patient Details Name: Adriana Newman MRN: [id number] DOB: 10-10-1971 Today's Date: 12/17/2011  Problem List: There is no problem list on file for this patient.   Past Medical History:  Past Medical History  Diagnosis Date  . PONV (postoperative nausea and vomiting)     prefers to have scope patch  . Asthma     allergy induced  . Sleep apnea 2012    uses cpap  . Anemia     iron deficiency  . GERD (gastroesophageal reflux disease)   . Depression   . Polycystic ovarian disease     takes Metformin   Past Surgical History:  Past Surgical History  Procedure Date  . Back surgery 2002    L5-S1 fusion  . Knee arthroscopy w/ acl reconstruction [id number]    bilateral  . Breast surgery 2010    reduction  . Cesarean section 2006    PT Assessment/Plan/Recommendation PT Assessment Clinical Impression Statement: pt presents s/p lumbar surgery.  pt with previous back surgeries and already aware of back precautions and log roll.   PT Recommendation/Assessment: Patient will need skilled PT in the acute care venue PT Problem List: Decreased strength;Decreased activity tolerance;Decreased mobility;Decreased balance;Decreased knowledge of use of DME;Decreased knowledge of precautions;Pain Barriers to Discharge: None PT Therapy Diagnosis : Difficulty walking;Acute pain PT Plan PT Frequency: Min 5X/week PT Treatment/Interventions: DME instruction;Gait training;Stair training;Functional mobility training;Therapeutic activities;Therapeutic exercise;Balance training;Patient/family education PT Recommendation Follow Up Recommendations: Home health PT Equipment Recommended: None recommended by PT PT Goals  Acute Rehab PT Goals PT Goal Formulation: With patient Time For Goal Achievement: 7 days Pt will go Sit to Stand: Independently PT Goal: Sit to Stand - Progress: Not met Pt will Ambulate: >150 feet;Independently PT Goal: Ambulate - Progress: Not met Pt  will Go Up / Down Stairs: Flight;with rail(s);with modified independence PT Goal: Up/Down Stairs - Progress: Not met Additional Goals Additional Goal #1: pt to verbalize 3/3 back precautions.   PT Goal: Additional Goal #1 - Progress: Not met  PT Evaluation Precautions/Restrictions  Precautions Precautions: Back Precaution Booklet Issued: Yes (comment) Required Braces or Orthoses: Yes Spinal Brace: Thoracolumbosacral orthotic (Left note requesting clarifying supine or sitting) Restrictions Weight Bearing Restrictions: No Prior Functioning  Home Living Lives With: Spouse Receives Help From: Family Type of Home: House Home Layout: Two level;1/2 bath on main level Alternate Level Stairs-Number of Steps: full flight Home Access: Stairs to enter Entrance Stairs-Rails: None Entrance Stairs-Number of Steps: 1 Home Adaptive Equipment: Bedside commode/3-in-1;Walker - rolling Prior Function Level of Independence: Independent with basic ADLs;Independent with homemaking with ambulation;Independent with gait;Independent with transfers Able to Take Stairs?: Yes Driving: Yes Cognition Cognition Orientation Level: Oriented X4 Sensation/Coordination   Extremity Assessment RLE Assessment RLE Assessment: Within Functional Limits LLE Assessment LLE Assessment: Within Functional Limits Mobility (including Balance) Bed Mobility Bed Mobility: Yes Rolling Left: 7: Independent Left Sidelying to Sit: 7: Independent Sitting - Scoot to Edge of Bed: 7: Independent Transfers Transfers: Yes Sit to Stand: 5: Supervision;With upper extremity assist;From bed Stand to Sit: 5: Supervision;With upper extremity assist;To chair/3-in-1;With armrests Stand to Sit Details: cues to control descent Ambulation/Gait Ambulation/Gait: Yes Ambulation/Gait Assistance: 4: Min assist Ambulation/Gait Assistance Details (indicate cue type and reason): slow, unsteady.  cues for back precautions Ambulation Distance  (Feet): 250 Feet Assistive device: None Gait Pattern: Step-through pattern;Decreased stride length;Shuffle Stairs: No Wheelchair Mobility Wheelchair Mobility: No    Exercise    End of Session PT - End of Session Equipment Utilized  During Treatment: Gait belt;Back brace Activity Tolerance: Patient tolerated treatment well;Patient limited by pain Patient left: in chair;with call bell in reach Nurse Communication: Mobility status for transfers;Mobility status for ambulation General Behavior During Session: [facility] for tasks performed Cognition: [facility] for tasks performed  Sunny Schlein, [place] [phone] 12/17/2011, 12:00 PM

## 2011-12-17 NOTE — Progress Notes (Signed)
OT Note  OT order received and appreciated.  Pt able to perform BADLs/functional transfers with mod I/supervision while demonstrating back safety precautions.  Pt has no equipment needs and has necessary level of assistance for d/c home.  Will not follow acutely.    12/17/2011 Cipriano Mile OTR/L Pager [phone] Office [phone]

## 2011-12-18 MED ORDER — SIMETHICONE 80 MG PO CHEW
80.0000 mg | CHEWABLE_TABLET | Freq: Four times a day (QID) | ORAL | Status: DC | PRN
  Administered 2011-12-18: 80 mg via ORAL
  Filled 2011-12-18: qty 1

## 2011-12-18 MED ORDER — ONDANSETRON HCL 4 MG/2ML IJ SOLN: 4.0000 mg | Freq: Four times a day (QID) | INTRAMUSCULAR | Status: DC | PRN

## 2011-12-18 MED FILL — Sodium Chloride Irrigation Soln 0.9%: Qty: 3000 | Status: AC

## 2011-12-18 MED FILL — Heparin Sodium (Porcine) Inj 1000 Unit/ML: INTRAMUSCULAR | Qty: 30 | Status: AC

## 2011-12-18 MED FILL — Sodium Chloride IV Soln 0.9%: INTRAVENOUS | Qty: 1000 | Status: AC

## 2011-12-18 NOTE — Progress Notes (Signed)
Physical Therapy Treatment Patient Details Name: Adriana Newman MRN: [id number] DOB: January 12, 1971 Today's Date: 12/18/2011  PT Assessment/Plan  PT - Assessment/Plan Comments on Treatment Session: Pt progressing well and is very motivated. Patient able to don brace without assistance. Encouraged patient to ambulation in hallways as much as she is able.  PT Plan: Discharge plan remains appropriate PT Frequency: Min 5X/week Follow Up Recommendations: Home health PT Equipment Recommended: None recommended by PT PT Goals  Acute Rehab PT Goals PT Goal: Sit to Stand - Progress: Progressing toward goal PT Goal: Ambulate - Progress: Progressing toward goal PT Goal: Up/Down Stairs - Progress: Progressing toward goal Additional Goals PT Goal: Additional Goal #1 - Progress: Progressing toward goal  PT Treatment Precautions/Restrictions  Precautions Precautions: Back Precaution Booklet Issued: Yes (comment) Required Braces or Orthoses: Yes Spinal Brace: Thoracolumbosacral orthotic Restrictions Weight Bearing Restrictions: No Mobility (including Balance) Bed Mobility Rolling Left: 7: Independent Left Sidelying to Sit: 7: Independent Sitting - Scoot to Edge of Bed: 7: Independent Transfers Sit to Stand: 5: Supervision;With upper extremity assist;From bed;From chair/3-in-1 Stand to Sit: 5: Supervision;To chair/3-in-1;With upper extremity assist Ambulation/Gait Ambulation/Gait: Yes Ambulation/Gait Assistance: 4: Min assist (MinGuard A) Ambulation/Gait Assistance Details (indicate cue type and reason): Pt had to take several breaks due to cramping and discomfort.  Ambulation Distance (Feet): 250 Feet Assistive device: None Gait Pattern: Within Functional Limits Stairs: Yes Stairs Assistance: 4: Min assist (MinGuard A) Stairs Assistance Details (indicate cue type and reason): Cues for technique and safety Stair Management Technique: Two rails Number of Stairs: 5     Exercise      End of Session PT - End of Session Equipment Utilized During Treatment: Gait belt;Back brace Activity Tolerance: Patient tolerated treatment well Patient left: in chair;with call bell in reach General Behavior During Session: [facility] for tasks performed Cognition: [facility] for tasks performed  Fredrich Birks 12/18/2011, 12:15 PM  12/18/2011 Fredrich Birks PTA [phone] pager [phone] office

## 2011-12-18 NOTE — Progress Notes (Signed)
PT refuses cpap for tonight. PT says she has not been wearing a cpap and that she is fine without one. RT will continue to monitor.

## 2011-12-18 NOTE — Progress Notes (Signed)
Utilization review completed. Marykatherine Sherwood, RN, BSN. 12/18/11  

## 2011-12-18 NOTE — Progress Notes (Signed)
Pt. Refused CPAP @ this time.

## 2011-12-18 NOTE — Progress Notes (Signed)
Pt reports minimal back pain. Right leg pain resolved.  Pain controlled with Norco  AVSS  Dressing CDI  Drain in place  Weakness in left foot improved.   Drain output: 150/10 hours (75 in last 8 hours)  POD #2 after revision decompression and fusion from L4-S1  - up with PT today with brace  - f/u drain output later this PM, may d/c then - Norco for pain, valium for spasms

## 2011-12-19 MED ORDER — DIAZEPAM 5 MG PO TABS: 5.0000 mg | ORAL_TABLET | Freq: Four times a day (QID) | ORAL | Status: AC | PRN

## 2011-12-19 MED ORDER — HYDROCODONE-ACETAMINOPHEN 10-325 MG PO TABS: 1.0000 | ORAL_TABLET | ORAL | Status: DC | PRN

## 2011-12-19 NOTE — Discharge Summary (Signed)
NAMEBRADEN, DELOACH NO.:  [id number]  MEDICAL RECORD NO.:  [id number]  LOCATION:  5011                         FACILITY:  [facility]  PHYSICIAN:  Estill Bamberg, MD      DATE OF BIRTH:  [birth year]  DATE OF ADMISSION:  12/16/2011 DATE OF DISCHARGE:  12/19/2011                              DISCHARGE SUMMARY   ADMISSION DIAGNOSIS:  Right-sided lumbar radiculopathy.  DISCHARGE DIAGNOSIS:  Right-sided lumbar radiculopathy.  ADMISSION HISTORY:  Briefly, Ms. Packard is a very pleasant 40 year old female who presented to my office with severe and debilitating pain in her right leg.  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  The patient was therefore admitted on December 16, 2011, for operative intervention.  HOSPITAL COURSE:  On December 16, 2011, the patient was admitted to [facility] and underwent a right-sided L4-5 transforaminal lumbar interbody fusion with revision of her L5-S1 instrumentation.  The patient tolerated the procedure well and was transferred to recovery in stable condition.  Blake drain was placed and was continued for until the morning of postop day #3.  The patient was noted to be neurovascularly intact throughout her hospital stay.  The patient's right-sided radiculopathy was entirely improved immediately after her procedure.  The patient was progressively mobilized throughout her hospital stay and was on the morning of postop day #3 where it was felt that the patient was safe for discharge home.  BRIEF DISCHARGE INSTRUCTIONS:  The patient will take Norco for pain and Valium for spasms.  She will wear her TLSO brace when she is up and ambulating.  She will follow up in my office in approximately 2 weeks after her procedure.     Estill Bamberg, MD     MD/MEDQ  D:  12/19/2011  T:   12/19/2011  Job:  [id number]

## 2011-12-19 NOTE — Progress Notes (Signed)
Physical Therapy Treatment Patient Details Name: Adriana Newman MRN: [id number] DOB: 1971-07-10 Today's Date: 12/19/2011  PT Assessment/Plan  PT - Assessment/Plan Comments on Treatment Session: Pt able to recall all precautions today and is adhering to them with all activities.  PT Plan: Discharge plan remains appropriate PT Frequency: Min 5X/week Follow Up Recommendations: Home health PT Equipment Recommended: None recommended by PT PT Goals  Acute Rehab PT Goals PT Goal: Ambulate - Progress: Progressing toward goal PT Goal: Up/Down Stairs - Progress: Progressing toward goal Additional Goals PT Goal: Additional Goal #1 - Progress: Met  PT Treatment Precautions/Restrictions  Precautions Precautions: Back Precaution Booklet Issued: Yes (comment) Required Braces or Orthoses: Yes Spinal Brace: Thoracolumbosacral orthotic Restrictions Weight Bearing Restrictions: No Mobility (including Balance) Bed Mobility Bed Mobility: No Transfers Sit to Stand: 6: Modified independent (Device/Increase time) Stand to Sit: 6: Modified independent (Device/Increase time) Ambulation/Gait Ambulation/Gait Assistance: 6: Modified independent (Device/Increase time) Ambulation Distance (Feet): 360 Feet Assistive device: Rolling walker Gait Pattern: Within Functional Limits Stairs: Yes Stairs Assistance: 5: Supervision Stairs Assistance Details (indicate cue type and reason): Cues for safety Stair Management Technique: One rail Left Number of Stairs: 14     Exercise    End of Session PT - End of Session Equipment Utilized During Treatment: Gait belt;Back brace Activity Tolerance: Patient tolerated treatment well Patient left: in chair General Behavior During Session: [facility] for tasks performed Cognition: [facility] for tasks performed  Adriana Newman, Adline Potter 12/19/2011, 1:20 PM

## 2011-12-19 NOTE — Progress Notes (Signed)
Pt reports minimal back pain. Right leg pain resolved.   Pain controlled with Norco  AVSS  Dressing CDI  Drain in place  Weakness in left foot improved.  Drain output: 75/10 hours  POD #3 after revision decompression and fusion from L4-S1  - up with PT today with brace  - d/c drain - d/c home today

## 2013-02-17 ENCOUNTER — Other Ambulatory Visit: Payer: Self-pay | Admitting: Dermatology

## 2013-05-18 ENCOUNTER — Other Ambulatory Visit (HOSPITAL_COMMUNITY)
Admission: RE | Admit: 2013-05-18 | Discharge: 2013-05-18 | Disposition: A | Discharge status: Home / Self Care | Payer: BC Managed Care – PPO | Source: Ambulatory Visit | Attending: Family Medicine | Admitting: Family Medicine

## 2013-05-18 ENCOUNTER — Other Ambulatory Visit: Payer: Self-pay | Admitting: Family Medicine

## 2013-05-18 DIAGNOSIS — Z124 Encounter for screening for malignant neoplasm of cervix: Secondary | ICD-10-CM | POA: Insufficient documentation

## 2013-05-18 DIAGNOSIS — Z1151 Encounter for screening for human papillomavirus (HPV): Secondary | ICD-10-CM | POA: Insufficient documentation

## 2014-02-22 ENCOUNTER — Other Ambulatory Visit: Payer: Self-pay | Admitting: Dermatology

## 2014-06-26 ENCOUNTER — Other Ambulatory Visit: Payer: Self-pay

## 2014-06-26 DIAGNOSIS — Z1231 Encounter for screening mammogram for malignant neoplasm of breast: Secondary | ICD-10-CM

## 2014-06-28 ENCOUNTER — Encounter (INDEPENDENT_AMBULATORY_CARE_PROVIDER_SITE_OTHER): Payer: Self-pay

## 2014-06-28 ENCOUNTER — Ambulatory Visit
Admission: RE | Admit: 2014-06-28 | Discharge: 2014-06-28 | Disposition: A | Discharge status: Home / Self Care | Payer: BC Managed Care – PPO | Source: Ambulatory Visit

## 2014-06-28 DIAGNOSIS — Z1231 Encounter for screening mammogram for malignant neoplasm of breast: Secondary | ICD-10-CM

## 2017-06-09 ENCOUNTER — Other Ambulatory Visit: Payer: Self-pay | Admitting: Family Medicine

## 2017-06-09 DIAGNOSIS — G43009 Migraine without aura, not intractable, without status migrainosus: Secondary | ICD-10-CM

## 2017-06-11 ENCOUNTER — Other Ambulatory Visit: Payer: Self-pay | Admitting: Family Medicine

## 2017-06-11 DIAGNOSIS — Z1231 Encounter for screening mammogram for malignant neoplasm of breast: Secondary | ICD-10-CM

## 2017-06-15 ENCOUNTER — Ambulatory Visit
Admission: RE | Admit: 2017-06-15 | Discharge: 2017-06-15 | Disposition: A | Discharge status: Home / Self Care | Payer: BC Managed Care – PPO | Source: Ambulatory Visit | Attending: Family Medicine | Admitting: Family Medicine

## 2017-06-15 DIAGNOSIS — G43009 Migraine without aura, not intractable, without status migrainosus: Secondary | ICD-10-CM

## 2017-06-24 ENCOUNTER — Other Ambulatory Visit: Payer: Self-pay | Admitting: Family Medicine

## 2017-06-24 DIAGNOSIS — N912 Amenorrhea, unspecified: Secondary | ICD-10-CM

## 2017-06-30 ENCOUNTER — Other Ambulatory Visit: Payer: Self-pay | Admitting: Family Medicine

## 2017-06-30 ENCOUNTER — Ambulatory Visit
Admission: RE | Admit: 2017-06-30 | Discharge: 2017-06-30 | Disposition: A | Discharge status: Home / Self Care | Payer: BC Managed Care – PPO | Source: Ambulatory Visit | Attending: Family Medicine | Admitting: Family Medicine

## 2017-06-30 DIAGNOSIS — Z1231 Encounter for screening mammogram for malignant neoplasm of breast: Secondary | ICD-10-CM

## 2017-06-30 DIAGNOSIS — N912 Amenorrhea, unspecified: Secondary | ICD-10-CM

## 2017-07-07 ENCOUNTER — Ambulatory Visit
Admission: RE | Admit: 2017-07-07 | Discharge: 2017-07-07 | Disposition: A | Discharge status: Home / Self Care | Payer: BC Managed Care – PPO | Source: Ambulatory Visit | Attending: Family Medicine | Admitting: Family Medicine

## 2017-07-07 ENCOUNTER — Other Ambulatory Visit: Payer: BC Managed Care – PPO

## 2017-07-07 DIAGNOSIS — N912 Amenorrhea, unspecified: Secondary | ICD-10-CM

## 2017-08-30 ENCOUNTER — Other Ambulatory Visit: Payer: Self-pay | Admitting: Family Medicine

## 2017-08-30 DIAGNOSIS — R3129 Other microscopic hematuria: Secondary | ICD-10-CM

## 2017-09-07 ENCOUNTER — Other Ambulatory Visit: Payer: Self-pay | Admitting: Obstetrics & Gynecology

## 2017-09-09 ENCOUNTER — Ambulatory Visit
Admission: RE | Admit: 2017-09-09 | Discharge: 2017-09-09 | Disposition: A | Discharge status: Home / Self Care | Payer: BC Managed Care – PPO | Source: Ambulatory Visit | Attending: Family Medicine | Admitting: Family Medicine

## 2017-09-09 DIAGNOSIS — R3129 Other microscopic hematuria: Secondary | ICD-10-CM

## 2017-09-20 ENCOUNTER — Other Ambulatory Visit: Payer: Self-pay | Admitting: Family Medicine

## 2017-09-20 DIAGNOSIS — N2889 Other specified disorders of kidney and ureter: Secondary | ICD-10-CM

## 2017-10-02 ENCOUNTER — Ambulatory Visit
Admission: RE | Admit: 2017-10-02 | Discharge: 2017-10-02 | Disposition: A | Discharge status: Home / Self Care | Payer: BC Managed Care – PPO | Source: Ambulatory Visit | Attending: Family Medicine | Admitting: Family Medicine

## 2017-10-02 DIAGNOSIS — N2889 Other specified disorders of kidney and ureter: Secondary | ICD-10-CM

## 2017-10-02 MED ORDER — GADOBENATE DIMEGLUMINE 529 MG/ML IV SOLN
20.0000 mL | Freq: Once | INTRAVENOUS | Status: AC | PRN
  Administered 2017-10-02: 20 mL via INTRAVENOUS

## 2018-02-11 IMAGING — MR MR ABDOMEN WO/W CM
17 series · 48 of 48 positions shown · IV contrast (Multihance 20 ml)
Comparison: Ultrasound 09/09/2017

CLINICAL DATA: Abnormal renal ultrasound.  RIGHT renal mass.

EXAM:
MRI ABDOMEN WITHOUT AND WITH CONTRAST
TECHNIQUE: Multiplanar multisequence MR imaging of the abdomen was performed
both before and after the administration of intravenous contrast.
CONTRAST:  20mL MULTIHANCE GADOBENATE DIMEGLUMINE 529 MG/ML IV SOLN

[Series 3: T2 · coronal · 5.0mm · 1.64mm/px · 1 of 36 slices shown (1 of 3)]
[im 1/36]
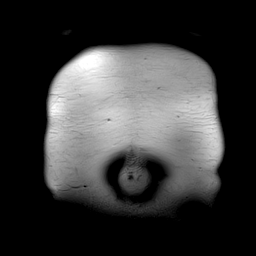

[Series 4: T2 · axial · 6.0mm · 1.31mm/px · 1 of 38 slices shown (2 of 3)]
[im 1/38]
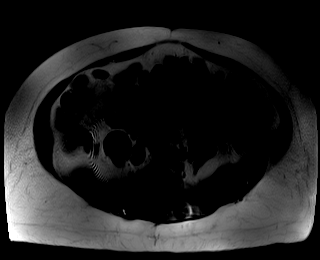

[Series 5: T1 · axial · 3.0mm · 1.31mm/px · z∈[-135,+150]mm · 6 of 192 slices shown]
[im 1/192]
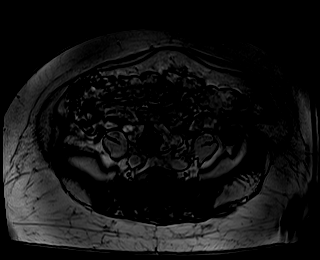
[im 39/192]
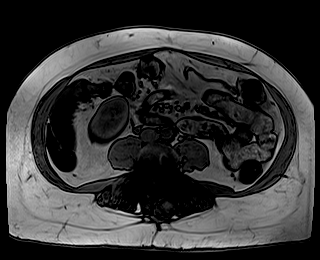
[im 77/192]
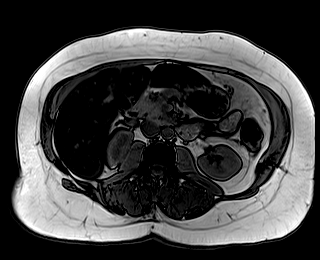
[im 115/192]
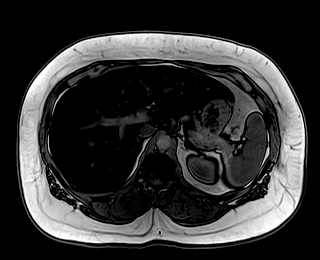
[im 153/192]
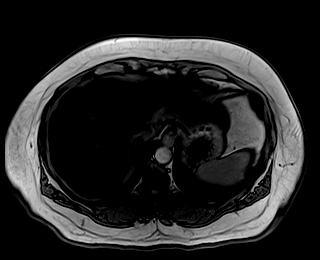
[im 192/192]
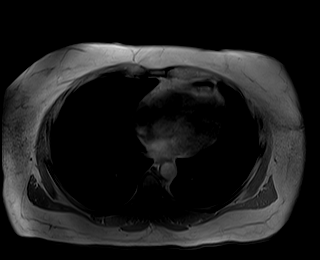

[Series 6: T2 · axial · 5.0mm · 1.64mm/px · z∈[-133,+149]mm · 2 of 48 slices shown (3 of 3)]
[im 1/48]
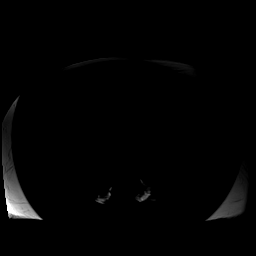
[im 48/48]
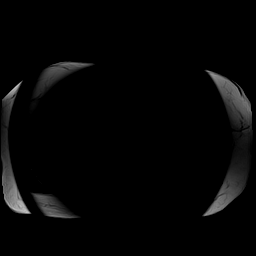

[Series 7: DWI · axial · 5.0mm · 1.57mm/px · z∈[-133,+149]mm · 5 of 144 slices shown (1 of 2)]
[im 1/144]
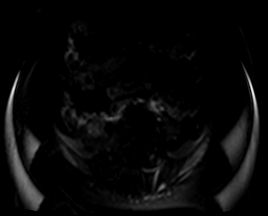
[im 36/144]
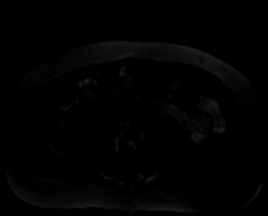
[im 72/144]
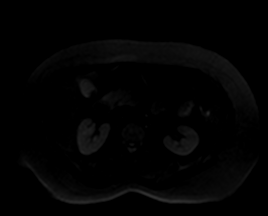
[im 108/144]
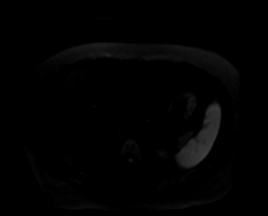
[im 144/144]
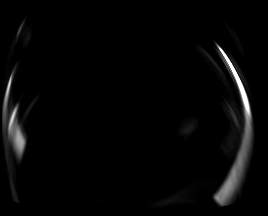

[Series 8: DWI · axial · 5.0mm · 1.57mm/px · z∈[-133,+149]mm · 2 of 48 slices shown (2 of 2)]
[im 1/48]
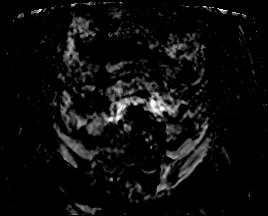
[im 48/48]
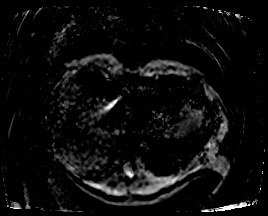

[Series 9: bSSFP · axial · 5.0mm · 1.31mm/px · z∈[-133,+149]mm · 2 of 48 slices shown]
[im 1/48]
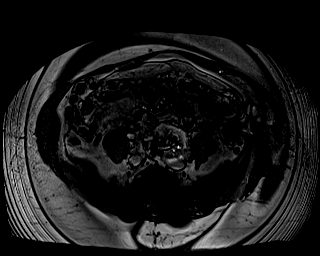
[im 48/48]
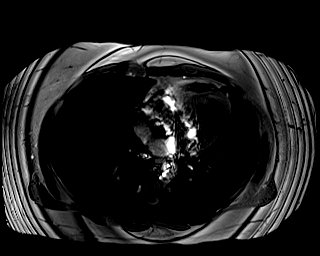

[Series 10: T1 dynamic · axial · non-contrast · 3.0mm · 1.31mm/px · z∈[-135,+150]mm · 3 of 96 slices shown]
[im 1/96]
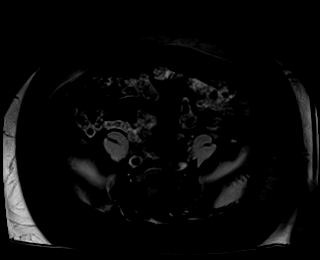
[im 48/96]
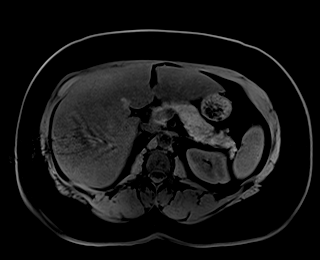
[im 96/96]
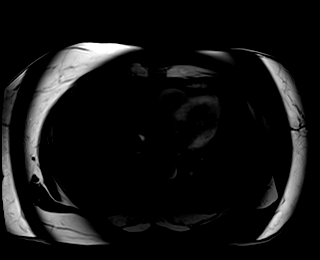

[Series 11: T1 dynamic post-contrast · axial · 3.0mm · 1.31mm/px · z∈[-135,+150]mm · 3 of 96 slices shown (1 of 9)]
[im 1/96]
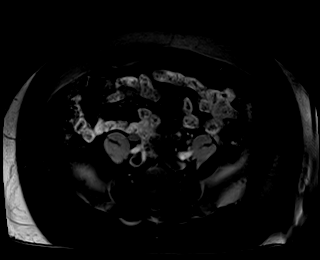
[im 48/96]
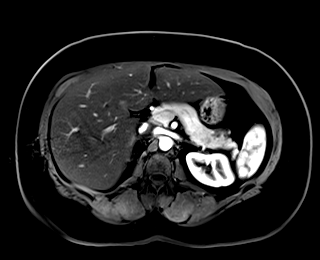
[im 96/96]
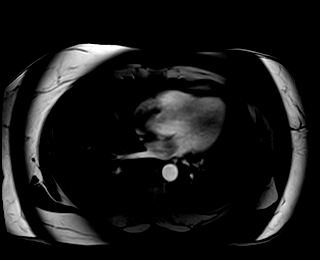

[Series 12: T1 dynamic post-contrast · axial · 3.0mm · 1.31mm/px · z∈[-135,+150]mm · 3 of 96 slices shown (2 of 9)]
[im 1/96]
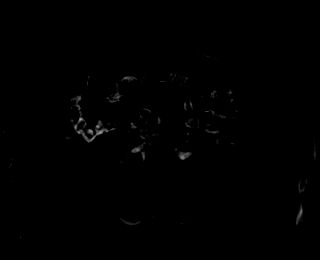
[im 48/96]
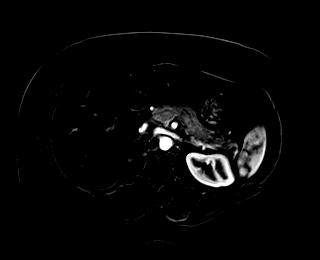
[im 96/96]
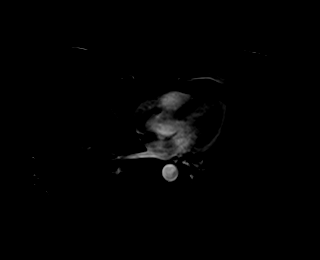

[Series 13: T1 dynamic post-contrast · axial · 3.0mm · 1.31mm/px · z∈[-135,+150]mm · 3 of 96 slices shown (3 of 9)]
[im 1/96]
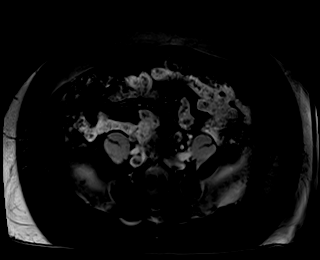
[im 48/96]
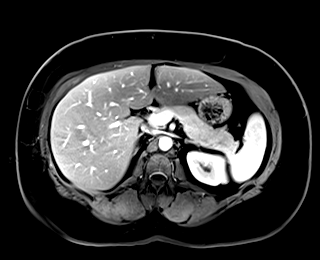
[im 96/96]
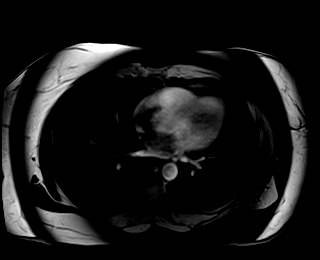

[Series 14: T1 dynamic post-contrast · axial · 3.0mm · 1.31mm/px · z∈[-135,+150]mm · 3 of 96 slices shown (4 of 9)]
[im 1/96]
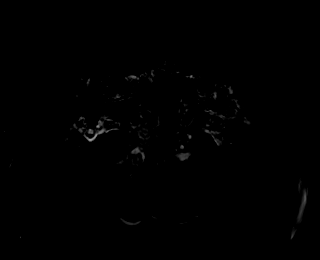
[im 48/96]
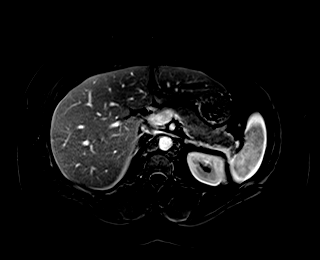
[im 96/96]
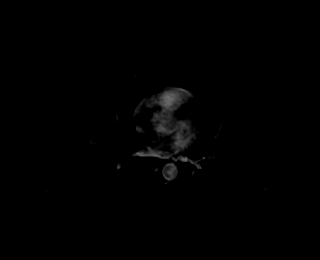

[Series 15: T1 dynamic post-contrast · axial · 3.0mm · 1.31mm/px · z∈[-135,+150]mm · 3 of 96 slices shown (5 of 9)]
[im 1/96]
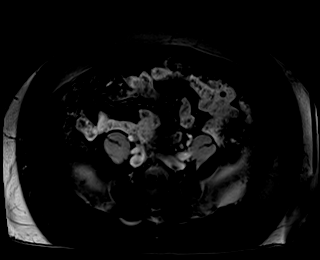
[im 48/96]
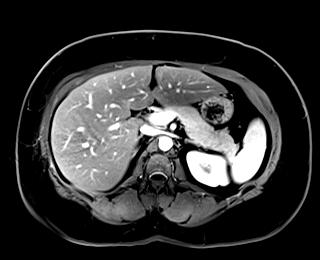
[im 96/96]
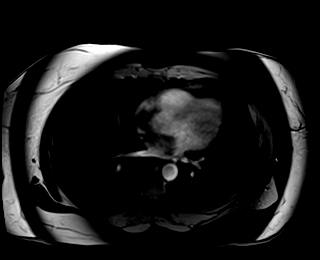

[Series 16: T1 dynamic post-contrast · axial · 3.0mm · 1.31mm/px · z∈[-135,+150]mm · 3 of 96 slices shown (6 of 9)]
[im 1/96]
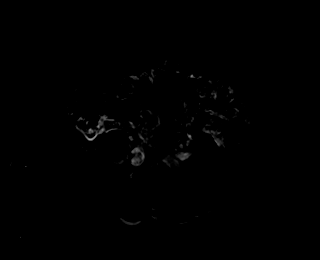
[im 48/96]
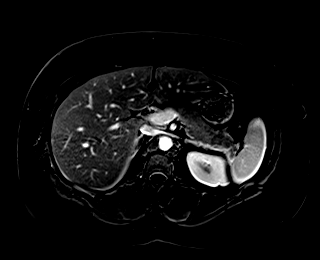
[im 96/96]
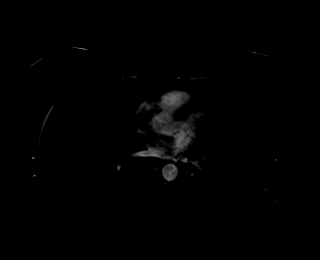

[Series 17: T1 dynamic post-contrast · coronal · 3.0mm · 1.31mm/px · 2 of 72 slices shown (7 of 9)]
[im 1/72]
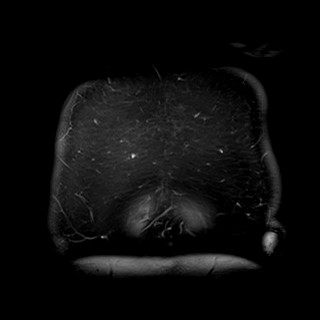
[im 72/72]
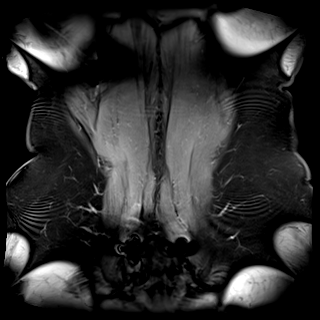

[Series 18: T1 dynamic post-contrast · axial · 3.0mm · 1.31mm/px · z∈[-135,+150]mm · 3 of 96 slices shown (8 of 9)]
[im 1/96]
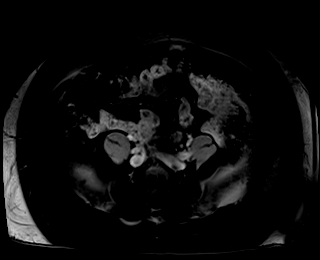
[im 48/96]
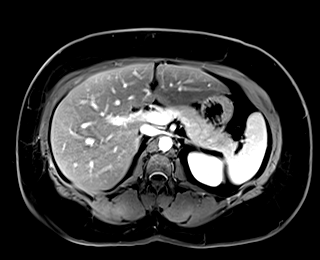
[im 96/96]
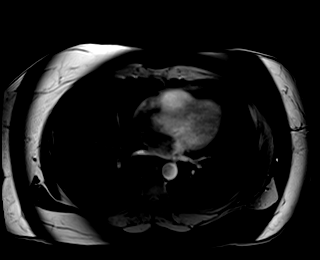

[Series 19: T1 dynamic post-contrast · axial · 3.0mm · 1.31mm/px · z∈[-135,+150]mm · 3 of 96 slices shown (9 of 9)]
[im 1/96]
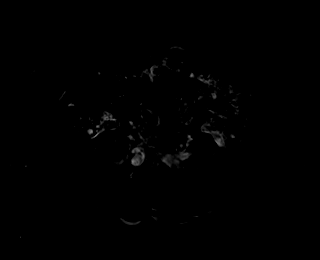
[im 48/96]
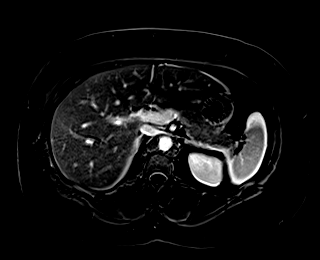
[im 96/96]
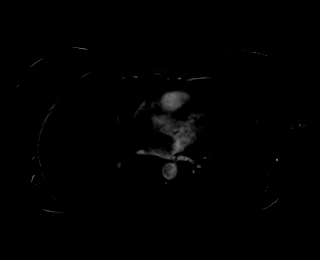

[48 of 48 positions shown; findings below may reference images not displayed]

FINDINGS: Lower chest:  Lung bases are clear.

Hepatobiliary: Loss of signal intensity on the opposed phase imaging
consistent with hepatic steatosis (series 5) no biliary duct
dilatation. Several large 1.5 cm calculi within non distended
gallbladder. Common bile duct normal.

Pancreas: Normal pancreatic parenchymal intensity. No ductal
dilatation or inflammation.

Spleen: Normal spleen.

Adrenals/urinary tract: Adrenal glands normal.

Within the mid cortex of the RIGHT kidney 1.5 cm lesion (image 31,
series 4) is hyperintense on T2 weighted imaging. Lesion is mildly
hyperintense on T1 weighted imaging and demonstrates no
post-contrast enhancement (image 75, series 12 and image 78, series
14). There is a fine hair like internal septation noted (image 79,
series 16).

LEFT kidney normal.

Stomach/Bowel: Stomach and limited of the small bowel is
unremarkable

Vascular/Lymphatic: Abdominal aortic normal caliber. No
retroperitoneal periportal lymphadenopathy.

Musculoskeletal: No aggressive osseous lesion
IMPRESSION: 1. Minimally complex cyst of the RIGHT kidney with fine hair like
septal enhancement (Bosniak 2 renal cysts). These probable benign
cysts require no specific follow-up.
2. Marked hepatic steatosis.

## 2018-07-07 ENCOUNTER — Other Ambulatory Visit: Payer: Self-pay | Admitting: Obstetrics & Gynecology

## 2018-07-07 ENCOUNTER — Other Ambulatory Visit: Payer: Self-pay | Admitting: Family Medicine

## 2018-07-07 DIAGNOSIS — Z1231 Encounter for screening mammogram for malignant neoplasm of breast: Secondary | ICD-10-CM

## 2018-07-21 ENCOUNTER — Ambulatory Visit
Admission: RE | Admit: 2018-07-21 | Discharge: 2018-07-21 | Disposition: A | Discharge status: Home / Self Care | Payer: BC Managed Care – PPO | Source: Ambulatory Visit | Attending: Obstetrics & Gynecology | Admitting: Obstetrics & Gynecology

## 2018-07-21 DIAGNOSIS — Z1231 Encounter for screening mammogram for malignant neoplasm of breast: Secondary | ICD-10-CM

## 2020-06-03 ENCOUNTER — Other Ambulatory Visit: Payer: Self-pay | Admitting: Obstetrics & Gynecology

## 2020-06-03 DIAGNOSIS — Z1231 Encounter for screening mammogram for malignant neoplasm of breast: Secondary | ICD-10-CM

## 2020-06-05 ENCOUNTER — Other Ambulatory Visit: Payer: Self-pay

## 2020-06-05 ENCOUNTER — Ambulatory Visit
Admission: RE | Admit: 2020-06-05 | Discharge: 2020-06-05 | Disposition: A | Discharge status: Home / Self Care | Payer: BC Managed Care – PPO | Source: Ambulatory Visit | Attending: Obstetrics & Gynecology | Admitting: Obstetrics & Gynecology

## 2020-06-05 DIAGNOSIS — Z1231 Encounter for screening mammogram for malignant neoplasm of breast: Secondary | ICD-10-CM

## 2020-07-29 ENCOUNTER — Other Ambulatory Visit: Payer: Self-pay | Admitting: Urology

## 2020-08-14 ENCOUNTER — Encounter (HOSPITAL_BASED_OUTPATIENT_CLINIC_OR_DEPARTMENT_OTHER): Payer: Self-pay | Admitting: Urology

## 2020-08-14 ENCOUNTER — Other Ambulatory Visit: Payer: Self-pay

## 2020-08-16 ENCOUNTER — Other Ambulatory Visit (HOSPITAL_COMMUNITY)
Admission: RE | Admit: 2020-08-16 | Discharge: 2020-08-16 | Disposition: A | Discharge status: Home / Self Care | Payer: BC Managed Care – PPO | Source: Ambulatory Visit | Attending: Urology | Admitting: Urology

## 2020-08-16 DIAGNOSIS — Z01812 Encounter for preprocedural laboratory examination: Secondary | ICD-10-CM | POA: Diagnosis not present

## 2020-08-16 DIAGNOSIS — Z20822 Contact with and (suspected) exposure to covid-19: Secondary | ICD-10-CM | POA: Insufficient documentation

## 2020-08-16 LAB — SARS CORONAVIRUS 2 (TAT 6-24 HRS): SARS Coronavirus 2: NEGATIVE

## 2020-08-19 NOTE — H&P (Signed)
Office Visit Report     07/26/2020   --------------------------------------------------------------------------------   Adriana Newman  MRN: [id number]  DOB: 04-Feb-1971, 49 year old Female  SSN: [id number]   PRIMARY CARE:  Adriana Hazel, MD  REFERRING:  Adriana Hazel, MD  PROVIDER:  Jerilee Newman, M.D.  LOCATION:  [facility] [phone]     --------------------------------------------------------------------------------   CC/HPI: Adriana Newman was referred for frequency and urgency and she had noted brown urine. Her UA showed 10-20 red blood cells per high-powered Newman and urine culture was mixed. She underwent renal ultrasound 07/25/2020 which showed mild-to-moderate bilateral hydronephrosis with large renal pelvic stones bilaterally. She was brought today for KUB which confirmed a left 2 cm left renal pelvic stone and a right 18 mm renal pelvic stone. No flank pain but sometimes urinary urgency. Bladder overall "better".     ALLERGIES: Penicillins - Hives percocet - Itching    MEDICATIONS: Metformin Hcl 1,000 mg tablet  Bupropion Xl 300 mg tablet, extended release 24 hr  Duloxetine Hcl 30 mg capsule,delayed release  Fluticasone Propionate 50 mcg/actuation spray, suspension  Iron  Montelukast Sodium 10 mg tablet  Multiple Vitamin  Vitamin B12     GU PSH: None     PSH Notes: Cesarean Section, Knee Surgery, Spine Repair   -Spinal fusion (2002, 2012)   NON-GU PSH: Breast Reduction Cesarean Delivery Only - 2010 Knee replacement, Bilateral Revise Cervix     GU PMH: Acute Cystitis/UTI, send urine for cx. start cephalexin - 07/15/2020 Microscopic hematuria, Could be from UTI but pt reports she took and and cx was negative. Will eval with cysto and renal US. If she doesn't improve, will get a cT scan . - 07/15/2020 Renal calculus, Bilateral kidney stones - 2014 Renal cyst, Renal cyst, acquired - 2014      PMH Notes:  2009-05-16 15:17:08 - Note:  Arthritis   NON-GU PMH: Asthma, Asthma - 2014 Personal history of other diseases of the circulatory system, History of hypertension - 2014 Depression Encounter for general adult medical examination without abnormal findings, Encounter for preventive health examination GERD Hypercholesterolemia Sleep Apnea    FAMILY HISTORY: Death of family member - Father    Notes: 3 sons; 4 daughters (deceased; all were born too pre mature to live)   SOCIAL HISTORY: Marital Status: Married Preferred Language: English; Ethnicity: Not Hispanic Or Latino; Race: White Current Smoking Status: Patient has never smoked.   Tobacco Use Assessment Completed: Used Tobacco in last 30 days? Does not use smokeless tobacco. Has never drank.  Drinks 1 caffeinated drink per day. Patient's occupation Pharmacist, community.     Notes: Occupation:, Alcohol Use, Caffeine Use, Marital History - Currently Married, Tobacco Use   REVIEW OF SYSTEMS:    GU Review Female:   Patient reports hard to postpone urination. Patient denies frequent urination, burning /pain with urination, get up at night to urinate, leakage of urine, stream starts and stops, trouble starting your stream, have to strain to urinate, and being pregnant.  Gastrointestinal (Upper):   Patient denies nausea, vomiting, and indigestion/ heartburn.  Gastrointestinal (Lower):   Patient denies diarrhea and constipation.  Constitutional:   Patient denies fever, night sweats, weight loss, and fatigue.  Skin:   Patient denies skin rash/ lesion and itching.  Eyes:   Patient denies blurred vision and double vision.  Ears/ Nose/ Throat:   Patient denies sinus problems and sore throat.  Hematologic/Lymphatic:   Patient denies swollen glands and easy bruising.  Cardiovascular:  Patient denies leg swelling and chest pains.  Respiratory:   Patient denies cough and shortness of breath.  Endocrine:   Patient denies excessive thirst.  Musculoskeletal:   Patient denies back  pain and joint pain.  Neurological:   Patient denies headaches and dizziness.  Psychologic:   Patient denies depression and anxiety.   VITAL SIGNS:      07/26/2020 10:22 AM  BP 133/88 mmHg  Pulse 111 /min  Temperature 97.7 F / 36.5 C   MULTI-SYSTEM PHYSICAL EXAMINATION:    Constitutional: Well-nourished. No physical deformities. Normally developed. Good grooming.  Neck: Neck symmetrical, not swollen. Normal tracheal position.  Respiratory: No labored breathing, no use of accessory muscles.   Cardiovascular: Normal temperature, normal extremity pulses, no swelling, no varicosities.  Neurologic / Psychiatric: Oriented to time, oriented to place, oriented to person. No depression, no anxiety, no agitation.  Gastrointestinal: No mass, no tenderness, no rigidity, non obese abdomen.     PAST DATA REVIEW: None   PROCEDURES:         KUB - 74018  A single view of the abdomen is obtained.  Calculi:  18 mm right renal pelvis and 20 mm left renal pelvis       The bones appeared normal. The bowel gas pattern appeared normal. The soft tissues were unremarkable.   Patient confirmed No Neulasta OnPro Device.           Urinalysis w/Scope Dipstick Dipstick Cont'd Micro  Color: Yellow Bilirubin: Neg mg/dL WBC/hpf: >69/CVE  Appearance: Cloudy Ketones: Neg mg/dL RBC/hpf: 10 - 93/YBO  Specific Gravity: 1.025 Blood: 3+ ery/uL Bacteria: Many (>50/hpf)  pH: 6.0 Protein: 3+ mg/dL Cystals: NS (Not Seen)  Glucose: Neg mg/dL Urobilinogen: 0.2 mg/dL Casts: NS (Not Seen)    Nitrites: Neg Trichomonas: Not Present    Leukocyte Esterase: 3+ leu/uL Mucous: Not Present      Epithelial Cells: NS (Not Seen)      Yeast: NS (Not Seen)      Sperm: Not Present    Notes: unspun microscopic performed    ASSESSMENT:      ICD-10 Details  1 GU:   Renal calculus - N20.0 Chronic, Worsening - This is a complicated problem. I drew her picture of the anatomy. We discussed the nature risks and benefits of active  surveillance, bilateral PCNL, shockwave lithotripsy or staged ureteroscopy. All questions answered. We will set her up for staged ureteroscopy. I will start her on nightly antibiotic and check a BUN and creatinine today.   PLAN:            Medications New Meds: Nitrofurantoin 50 mg capsule 1 capsule PO Q HS   #30  0 Refill(s)            Orders Labs BUN/Creatinine          Schedule Return Visit/Planned Activity: Next Available Appointment - Schedule Surgery             Note: cancel the 9/22 appt           Document Letter(s):  Created for Patient: Clinical Summary         Notes:   cc; Dr.Miller         Next Appointment:      Next Appointment: 09/11/2020 03:15 PM    Appointment Type: Female Cysto    Location: [facility] [phone] [id number]    Provider: Jerilee Newman, M.D.    Reason for Visit: next avail cysto, ov      *  Signed by Adriana Newman, M.D. on 07/29/20 at 9:35 AM (EDT)*     The information contained in this medical record document is considered private and confidential patient information. This information can only be used for the medical diagnosis and/or medical services that are being provided by the patient's selected caregivers. This information can only be distributed outside of the patient's care if the patient agrees and signs waivers of authorization for this information to be sent to an outside source or route.

## 2020-08-20 ENCOUNTER — Encounter (HOSPITAL_BASED_OUTPATIENT_CLINIC_OR_DEPARTMENT_OTHER)
Admission: RE | Disposition: A | Discharge status: Home / Self Care | Payer: Self-pay | Source: Home / Self Care | Attending: Urology

## 2020-08-20 ENCOUNTER — Ambulatory Visit (HOSPITAL_BASED_OUTPATIENT_CLINIC_OR_DEPARTMENT_OTHER): Payer: BC Managed Care – PPO | Admitting: Anesthesiology

## 2020-08-20 ENCOUNTER — Other Ambulatory Visit: Payer: Self-pay

## 2020-08-20 ENCOUNTER — Encounter (HOSPITAL_BASED_OUTPATIENT_CLINIC_OR_DEPARTMENT_OTHER): Payer: Self-pay | Admitting: Urology

## 2020-08-20 ENCOUNTER — Ambulatory Visit (HOSPITAL_BASED_OUTPATIENT_CLINIC_OR_DEPARTMENT_OTHER)
Admission: RE | Admit: 2020-08-20 | Discharge: 2020-08-20 | Disposition: A | Discharge status: Home / Self Care | Payer: BC Managed Care – PPO | Attending: Urology | Admitting: Urology

## 2020-08-20 DIAGNOSIS — I10 Essential (primary) hypertension: Secondary | ICD-10-CM | POA: Insufficient documentation

## 2020-08-20 DIAGNOSIS — K219 Gastro-esophageal reflux disease without esophagitis: Secondary | ICD-10-CM | POA: Diagnosis not present

## 2020-08-20 DIAGNOSIS — F329 Major depressive disorder, single episode, unspecified: Secondary | ICD-10-CM | POA: Diagnosis not present

## 2020-08-20 DIAGNOSIS — Z96653 Presence of artificial knee joint, bilateral: Secondary | ICD-10-CM | POA: Insufficient documentation

## 2020-08-20 DIAGNOSIS — G4730 Sleep apnea, unspecified: Secondary | ICD-10-CM | POA: Insufficient documentation

## 2020-08-20 DIAGNOSIS — J45909 Unspecified asthma, uncomplicated: Secondary | ICD-10-CM | POA: Insufficient documentation

## 2020-08-20 DIAGNOSIS — Z79899 Other long term (current) drug therapy: Secondary | ICD-10-CM | POA: Insufficient documentation

## 2020-08-20 DIAGNOSIS — Z7984 Long term (current) use of oral hypoglycemic drugs: Secondary | ICD-10-CM | POA: Diagnosis not present

## 2020-08-20 DIAGNOSIS — N200 Calculus of kidney: Secondary | ICD-10-CM

## 2020-08-20 HISTORY — PX: CYSTOSCOPY/URETEROSCOPY/HOLMIUM LASER/STENT PLACEMENT: SHX6546

## 2020-08-20 HISTORY — DX: Stress incontinence (female) (male): N39.3

## 2020-08-20 HISTORY — PX: HOLMIUM LASER APPLICATION: SHX5852

## 2020-08-20 HISTORY — DX: Presence of spectacles and contact lenses: Z97.3

## 2020-08-20 HISTORY — DX: Iron deficiency anemia, unspecified: D50.9

## 2020-08-20 HISTORY — DX: Other asthma: J45.998

## 2020-08-20 HISTORY — DX: Polycystic ovarian syndrome: E28.2

## 2020-08-20 HISTORY — DX: Personal history of traumatic brain injury: Z87.820

## 2020-08-20 HISTORY — DX: Calculus of kidney: N20.0

## 2020-08-20 LAB — POCT PREGNANCY, URINE: Preg Test, Ur: NEGATIVE

## 2020-08-20 SURGERY — CYSTOSCOPY/URETEROSCOPY/HOLMIUM LASER/STENT PLACEMENT
Anesthesia: General | Site: Renal | Laterality: Bilateral

## 2020-08-20 MED ORDER — FENTANYL CITRATE (PF) 100 MCG/2ML IJ SOLN
INTRAMUSCULAR | Status: DC | PRN
Start: 2020-08-20 — End: 2020-08-20
  Administered 2020-08-20 (×2): 50 ug via INTRAVENOUS

## 2020-08-20 MED ORDER — MIDAZOLAM HCL 2 MG/2ML IJ SOLN
INTRAMUSCULAR | Status: AC
  Filled 2020-08-20: qty 2

## 2020-08-20 MED ORDER — ONDANSETRON HCL 4 MG/2ML IJ SOLN
INTRAMUSCULAR | Status: DC | PRN
  Administered 2020-08-20: 4 mg via INTRAVENOUS

## 2020-08-20 MED ORDER — SODIUM CHLORIDE 0.9 % IR SOLN
Status: DC | PRN
  Administered 2020-08-20 (×2): 3000 mL

## 2020-08-20 MED ORDER — CIPROFLOXACIN IN D5W 400 MG/200ML IV SOLN
400.0000 mg | Freq: Once | INTRAVENOUS | Status: AC
  Administered 2020-08-20: 400 mg via INTRAVENOUS

## 2020-08-20 MED ORDER — KETOROLAC TROMETHAMINE 30 MG/ML IJ SOLN
INTRAMUSCULAR | Status: DC | PRN
  Administered 2020-08-20: 30 mg via INTRAVENOUS

## 2020-08-20 MED ORDER — FENTANYL CITRATE (PF) 100 MCG/2ML IJ SOLN: 25.0000 ug | INTRAMUSCULAR | Status: DC | PRN

## 2020-08-20 MED ORDER — DEXAMETHASONE SODIUM PHOSPHATE 10 MG/ML IJ SOLN
INTRAMUSCULAR | Status: AC
  Filled 2020-08-20: qty 1

## 2020-08-20 MED ORDER — CIPROFLOXACIN IN D5W 400 MG/200ML IV SOLN
INTRAVENOUS | Status: AC
  Filled 2020-08-20: qty 200

## 2020-08-20 MED ORDER — KETOROLAC TROMETHAMINE 30 MG/ML IJ SOLN
INTRAMUSCULAR | Status: AC
  Filled 2020-08-20: qty 1

## 2020-08-20 MED ORDER — LIDOCAINE 2% (20 MG/ML) 5 ML SYRINGE
INTRAMUSCULAR | Status: AC
  Filled 2020-08-20: qty 5

## 2020-08-20 MED ORDER — DEXAMETHASONE SODIUM PHOSPHATE 10 MG/ML IJ SOLN
INTRAMUSCULAR | Status: DC | PRN
  Administered 2020-08-20: 10 mg via INTRAVENOUS

## 2020-08-20 MED ORDER — MIDAZOLAM HCL 5 MG/5ML IJ SOLN
INTRAMUSCULAR | Status: DC | PRN
  Administered 2020-08-20: 2 mg via INTRAVENOUS

## 2020-08-20 MED ORDER — ACETAMINOPHEN 500 MG PO TABS
1000.0000 mg | ORAL_TABLET | Freq: Once | ORAL | Status: AC
  Administered 2020-08-20: 1000 mg via ORAL

## 2020-08-20 MED ORDER — SCOPOLAMINE 1 MG/3DAYS TD PT72
MEDICATED_PATCH | TRANSDERMAL | Status: AC
  Filled 2020-08-20: qty 1

## 2020-08-20 MED ORDER — IOHEXOL 300 MG/ML  SOLN
INTRAMUSCULAR | Status: DC | PRN
  Administered 2020-08-20: 12 mL

## 2020-08-20 MED ORDER — LACTATED RINGERS IV SOLN: INTRAVENOUS | Status: DC

## 2020-08-20 MED ORDER — PROPOFOL 10 MG/ML IV BOLUS
INTRAVENOUS | Status: DC | PRN
  Administered 2020-08-20: 200 mg via INTRAVENOUS

## 2020-08-20 MED ORDER — PROPOFOL 10 MG/ML IV BOLUS
INTRAVENOUS | Status: AC
  Filled 2020-08-20: qty 40

## 2020-08-20 MED ORDER — ONDANSETRON HCL 4 MG/2ML IJ SOLN
INTRAMUSCULAR | Status: AC
  Filled 2020-08-20: qty 2

## 2020-08-20 MED ORDER — SCOPOLAMINE 1 MG/3DAYS TD PT72
MEDICATED_PATCH | TRANSDERMAL | Status: DC | PRN
  Administered 2020-08-20: 1 via TRANSDERMAL

## 2020-08-20 MED ORDER — FENTANYL CITRATE (PF) 100 MCG/2ML IJ SOLN
INTRAMUSCULAR | Status: AC
  Filled 2020-08-20: qty 2

## 2020-08-20 MED ORDER — ACETAMINOPHEN 500 MG PO TABS
ORAL_TABLET | ORAL | Status: AC
  Filled 2020-08-20: qty 2

## 2020-08-20 MED ORDER — LIDOCAINE 2% (20 MG/ML) 5 ML SYRINGE
INTRAMUSCULAR | Status: DC | PRN
  Administered 2020-08-20: 100 mg via INTRAVENOUS

## 2020-08-20 MED ORDER — LIDOCAINE HCL URETHRAL/MUCOSAL 2 % EX GEL
CUTANEOUS | Status: DC | PRN
  Administered 2020-08-20: 1 via URETHRAL

## 2020-08-20 SURGICAL SUPPLY — 30 items
BAG DRAIN URO-CYSTO SKYTR STRL (DRAIN) ×3 IMPLANT
BAG DRN UROCATH (DRAIN) ×1
CATH URET 5FR 28IN CONE TIP (BALLOONS)
CATH URET 5FR 28IN OPEN ENDED (CATHETERS) ×2 IMPLANT
CATH URET 5FR 70CM CONE TIP (BALLOONS) IMPLANT
CATH URET DUAL LUMEN 6-10FR 50 (CATHETERS) IMPLANT
CLOTH BEACON ORANGE TIMEOUT ST (SAFETY) ×3 IMPLANT
FIBER LASER FLEXIVA 365 (UROLOGICAL SUPPLIES) ×2 IMPLANT
FIBER LASER TRAC TIP (UROLOGICAL SUPPLIES) ×2 IMPLANT
GLOVE BIO SURGEON STRL SZ7.5 (GLOVE) ×3 IMPLANT
GLOVE BIO SURGEON STRL SZ8 (GLOVE) IMPLANT
GLOVE BIOGEL PI IND STRL 7.0 (GLOVE) IMPLANT
GLOVE BIOGEL PI INDICATOR 7.0 (GLOVE) ×2
GLOVE SURG SYN 6.5 ES PF (GLOVE) ×3 IMPLANT
GLOVE SURG SYN 6.5 PF PI (GLOVE) IMPLANT
GOWN STRL REUS W/TWL LRG LVL3 (GOWN DISPOSABLE) ×3 IMPLANT
GOWN STRL REUS W/TWL XL LVL3 (GOWN DISPOSABLE) ×2 IMPLANT
GUIDEWIRE ANG ZIPWIRE 038X150 (WIRE) IMPLANT
GUIDEWIRE STR DUAL SENSOR (WIRE) ×3 IMPLANT
GUIDEWIRE ZIPWRE .038 STRAIGHT (WIRE) ×2 IMPLANT
IV NS IRRIG 3000ML ARTHROMATIC (IV SOLUTION) ×6 IMPLANT
KIT TURNOVER CYSTO (KITS) ×3 IMPLANT
MANIFOLD NEPTUNE II (INSTRUMENTS) ×3 IMPLANT
NS IRRIG 500ML POUR BTL (IV SOLUTION) ×3 IMPLANT
PACK CYSTO (CUSTOM PROCEDURE TRAY) ×3 IMPLANT
SHEATH URETERAL 12FRX28CM (UROLOGICAL SUPPLIES) ×2 IMPLANT
STENT URET 6FRX26 CONTOUR (STENTS) ×4 IMPLANT
TUBE CONNECTING 12'X1/4 (SUCTIONS) ×1
TUBE CONNECTING 12X1/4 (SUCTIONS) ×2 IMPLANT
TUBING UROLOGY SET (TUBING) ×3 IMPLANT

## 2020-08-20 NOTE — Transfer of Care (Signed)
Immediate Anesthesia Transfer of Care Note  Patient: Adriana Newman  Procedure(s) Performed: CYSTOSCOPY/ RETROGRADES /URETEROSCOPY/HOLMIUM LASER LITHOTRIPSY /STENT PLACEMENT (Bilateral Renal) HOLMIUM LASER APPLICATION (Bilateral Renal)  Patient Location: PACU  Anesthesia Type:General  Level of Consciousness: drowsy  Airway & Oxygen Therapy: Patient Spontanous Breathing and Patient connected to nasal cannula oxygen  Post-op Assessment: Report given to RN  Post vital signs: Reviewed and stable  Last Vitals:  Vitals Value Taken Time  BP 151/74 08/20/20 1024  Temp    Pulse 105 08/20/20 1025  Resp 18 08/20/20 1025  SpO2 99 % 08/20/20 1025  Vitals shown include unvalidated device data.  Last Pain:  Vitals:   08/20/20 0641  TempSrc: Oral  PainSc: 0-No pain         Complications: No complications documented.

## 2020-08-20 NOTE — Op Note (Signed)
Preoperative diagnosis: Bilateral renal stones Postoperative diagnosis: Same  Procedure: Cystoscopy with bilateral retrograde pyelogram, bilateral ureteroscopy with holmium laser lithotripsy and bilateral ureteral stent placement  Surgeon: Mena Goes  Anesthesia: General  Indication for procedure: Atalaya is a 49 year old female who was being evaluated for hematuria.  A CT scan revealed a 2 cm left renal pelvic stone and a left lower pole stone.  She also had a 18 mm right renal pelvic stone.  She was brought today for above procedure.  Findings: On exam under anesthesia the vulva appeared normal.  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  On cystoscopic exam I did not see any evidence of a urethral diverticulum.  The bladder was unremarkable with no stone or foreign body.  There was a stage I cystocele.  Left retrograde pyelogram-this outlined a very straight ureter with no filling defect to the renal pelvis that had a large filling defect consistent with the left stone.  Right retrograde pyelogram-this outlined a very straight ureter without filling defect with a large filling defect in the right renal pelvis consistent with the stone.  On ureteroscopy left stone was noted.  It was large and very little working room initially but the stone was mostly dusted.  There may be some significant fragments.  I then went to the lower pole and that stone was 6 to 8 mm.  It was fragmented but again might have some significant fragments.  The laser fiber on the side lost its tip and a new fiber was passed up but I believe she will need a second look on the side.  On right ureteroscopy the right stone was noted and it was about two thirds dusted.  The laser then also disintegrated and we had been going for about 90 minutes and the side will need a second look as well.  Description of procedure: After consent  was obtained patient brought to the operating room.  After adequate anesthesia she is placed in lithotomy position and prepped and draped in the usual sterile fashion.  A timeout was performed to confirm the patient and procedure.  The cystoscope was passed per urethra and the bladder carefully inspected.  Left ureteral orifice was cannulated with a 5 [place] open-ended catheter and retrograde injection of contrast was performed.  I then advanced a sensor wire up the left side and used the access sheath to get 2 wires.  I left the sensor wires the safety wire and passed the access sheath.  Access sheath went without difficulty.  The dual channel digital ureteroscope was advanced up the ureter without difficulty to the left stone.  I then passed the 365 m fiber which did pass up through the digital scope without difficulty.  At a setting of 0.4 and 50 the large renal pelvic stone was dusted.  This took quite some time.  I then went to the lower pole and dusted that stone.  The laser tip had disintegrated and worked its way back into the plastic therefore we swapped out to a new smaller 265 m laser fiber so that I could get into the lower pole easier.  The stone was then fragmented but again there might be some significant fragments.  The access sheath was backed out on the ureteroscope and the renal pelvis and ureter carefully inspected on the way out noted to be no injury.  The wire was backloaded on the  cystoscope and a 6 x 26 cm advanced.  The wire was removed with good coil seen in the kidney and a good coil in the bladder.  Attention was then turned to the right side where the right ureteral orifice was cannulated with a 5 [place] open-ended catheter and retrograde injection of contrast was performed.  A sensor wire was then advanced in the access used to get 2 wires up.  I then went adjacent to the sensor wire leaving it as the safety wire.  The dual channel digital was passed back up the right side where the  stone was located.  It was fragmented until about a half of it remained and I was able to push it up into the upper pole.  I then was able to wiggle it down to about a third of it remaining and the laser fiber had disintegrated back into the plastic.  Instead of switching to a new fiber I think it be best to let things settle down, keep her on a nightly antibiotic and do a second look on both sides to dust any remaining fragments and the remainder of the right stone.  The access sheath was backed out on the ureteroscope in the renal pelvis and ureter inspected on the right on the way out and noted to be normal without any injury.  The wire was then backloaded on the cystoscope and a 6 x 26 cm stent advanced.  A good coil seen in the kidney and a good coil in the bladder.  The bladder was drained and the scope removed and some lidocaine jelly was instilled per urethra.  She was awakened and taken to recovery room in stable condition.  Complications: None  Blood loss: Minimal  Specimens: None  Drains: Bilateral 6 x 26 cm ureteral stent  Disposition: Patient stable to PACU -I discussed the procedure and postop care with the patient's husband in the conference room.  Also discussed need for staged procedure and the office was call to schedule the next procedure.

## 2020-08-20 NOTE — Interval H&P Note (Signed)
History and Physical Interval Note:  08/20/2020 7:35 AM  Adriana Newman  has presented today for surgery, with the diagnosis of BILATERAL RENAL STONES.  The various methods of treatment have been discussed with the patient and family. After consideration of risks, benefits and other options for treatment, the patient has consented to  Procedure(s): CYSTOSCOPY/URETEROSCOPY/HOLMIUM LASER/STENT PLACEMENT (Bilateral) as a surgical intervention.  The patient's history has been reviewed, patient examined, no change in status, stable for surgery. We discussed again given stone size and location she may need a staged procedure. She is well with no dysuria or fever. She is on nightly NF. She has freq/urgency - stable. I have reviewed the patient's chart and labs.  Questions were answered to the patient's satisfaction.     Jerilee Field

## 2020-08-20 NOTE — Anesthesia Preprocedure Evaluation (Signed)
Anesthesia Evaluation  Patient identified by MRN, date of birth, ID band Patient awake    Reviewed: Allergy & Precautions, NPO status , Patient's Chart, lab work & pertinent test results  History of Anesthesia Complications (+) PONV  Airway Mallampati: II  TM Distance: >3 FB Neck ROM: Full    Dental no notable dental hx.    Pulmonary asthma , sleep apnea and Continuous Positive Airway Pressure Ventilation ,    Pulmonary exam normal breath sounds clear to auscultation       Cardiovascular negative cardio ROS Normal cardiovascular exam Rhythm:Regular Rate:Normal     Neuro/Psych PSYCHIATRIC DISORDERS Depression negative neurological ROS     GI/Hepatic Neg liver ROS, GERD  Medicated,  Endo/Other  negative endocrine ROSObese BMI 37, PCOS on metformin  Renal/GU Renal disease (b/l renal stones)  negative genitourinary   Musculoskeletal negative musculoskeletal ROS (+)   Abdominal   Peds  Hematology negative hematology ROS (+)   Anesthesia Other Findings   Reproductive/Obstetrics                             Anesthesia Physical Anesthesia Plan  ASA: III  Anesthesia Plan: General   Post-op Pain Management:    Induction: Intravenous  PONV Risk Score and Plan: 4 or greater and Ondansetron, Dexamethasone, Midazolam and Scopolamine patch - Pre-op  Airway Management Planned: LMA  Additional Equipment:   Intra-op Plan:   Post-operative Plan: Extubation in OR  Informed Consent: I have reviewed the patients History and Physical, chart, labs and discussed the procedure including the risks, benefits and alternatives for the proposed anesthesia with the patient or authorized representative who has indicated his/her understanding and acceptance.     Dental advisory given  Plan Discussed with: CRNA  Anesthesia Plan Comments:         Anesthesia Quick Evaluation

## 2020-08-20 NOTE — Anesthesia Procedure Notes (Signed)
Procedure Name: LMA Insertion Date/Time: 08/20/2020 8:42 AM Performed by: Briant Sites, CRNA Pre-anesthesia Checklist: Patient identified, Emergency Drugs available, Suction available and Patient being monitored Patient Re-evaluated:Patient Re-evaluated prior to induction Oxygen Delivery Method: Circle system utilized Preoxygenation: Pre-oxygenation with 100% oxygen Induction Type: IV induction Ventilation: Mask ventilation without difficulty LMA: LMA inserted LMA Size: 4.0 Number of attempts: 1 Airway Equipment and Method: Bite block Placement Confirmation: positive ETCO2 Dental Injury: Teeth and Oropharynx as per pre-operative assessment

## 2020-08-20 NOTE — Discharge Instructions (Signed)
Ureteral Stent Implantation, Care After This sheet gives you information about how to care for yourself after your procedure. Your health care provider may also give you more specific instructions. If you have problems or questions, contact your health care provider.  Removal of the stents/stones: Be sure to follow-up with Dr. Mena Goes for stone and stent removal.  His office will call with the date of the next procedure.  Continue your nightly antibiotic.  What can I expect after the procedure? After the procedure, it is common to have:  Nausea.  Mild pain when you urinate. You may feel this pain in your lower back or lower abdomen. The pain should stop within a few minutes after you urinate. This may last for up to 1 week.  A small amount of blood in your urine for several days. Follow these instructions at home: Medicines  Take over-the-counter and prescription medicines only as told by your health care provider.  If you were prescribed an antibiotic medicine, take it as told by your health care provider. Do not stop taking the antibiotic even if you start to feel better.  Do not drive for 24 hours if you were given a sedative during your procedure.  Ask your health care provider if the medicine prescribed to you requires you to avoid driving or using heavy machinery. Activity  Rest as told by your health care provider.  Avoid sitting for a long time without moving. Get up to take short walks every 1-2 hours. This is important to improve blood flow and breathing. Ask for help if you feel weak or unsteady.  Return to your normal activities as told by your health care provider. Ask your health care provider what activities are safe for you. General instructions   Watch for any blood in your urine. Call your health care provider if the amount of blood in your urine increases.  If you have a catheter: ? Follow instructions from your health care provider about taking care of your  catheter and collection bag. ? Do not take baths, swim, or use a hot tub until your health care provider approves. Ask your health care provider if you may take showers. You may only be allowed to take sponge baths.  Drink enough fluid to keep your urine pale yellow.  Do not use any products that contain nicotine or tobacco, such as cigarettes, e-cigarettes, and chewing tobacco. These can delay healing after surgery. If you need help quitting, ask your health care provider.  Keep all follow-up visits as told by your health care provider. This is important. Contact a health care provider if:  You have pain that gets worse or does not get better with medicine, especially pain when you urinate.  You have difficulty urinating.  You feel nauseous or you vomit repeatedly during a period of more than 2 days after the procedure. Get help right away if:  Your urine is dark red or has blood clots in it.  You are leaking urine (have incontinence).  The end of the stent comes out of your urethra.  You cannot urinate.  You have sudden, sharp, or severe pain in your abdomen or lower back.  You have a fever.  You have swelling or pain in your legs.  You have difficulty breathing. Summary  After the procedure, it is common to have mild pain when you urinate that goes away within a few minutes after you urinate. This may last for up to 1 week.  Watch for  any blood in your urine. Call your health care provider if the amount of blood in your urine increases.  Take over-the-counter and prescription medicines only as told by your health care provider.  Drink enough fluid to keep your urine pale yellow. This information is not intended to replace advice given to you by your health care provider. Make sure you discuss any questions you have with your health care provider. Document Revised: 09/13/2018 Document Reviewed: 09/14/2018 Elsevier Patient Education  2020 [employer].

## 2020-08-20 NOTE — Anesthesia Postprocedure Evaluation (Signed)
Anesthesia Post Note  Patient: Adriana Newman  Procedure(s) Performed: CYSTOSCOPY/ RETROGRADES /URETEROSCOPY/HOLMIUM LASER LITHOTRIPSY /STENT PLACEMENT (Bilateral Renal) HOLMIUM LASER APPLICATION (Bilateral Renal)     Patient location during evaluation: PACU Anesthesia Type: General Level of consciousness: awake and alert Pain management: pain level controlled Vital Signs Assessment: post-procedure vital signs reviewed and stable Respiratory status: spontaneous breathing, nonlabored ventilation, respiratory function stable and patient connected to nasal cannula oxygen Cardiovascular status: blood pressure returned to baseline and stable Postop Assessment: no apparent nausea or vomiting Anesthetic complications: no   No complications documented.  Last Vitals:  Vitals:   08/20/20 1045 08/20/20 1054  BP: (!) 147/78 (!) 146/76  Pulse: 99 (!) 101  Resp: 13 16  Temp:  36.8 C  SpO2: 99% 94%    Last Pain:  Vitals:   08/20/20 1054  TempSrc:   PainSc: 0-No pain                 Jashayla Glatfelter L Omero Kowal

## 2020-08-21 ENCOUNTER — Encounter (HOSPITAL_BASED_OUTPATIENT_CLINIC_OR_DEPARTMENT_OTHER): Payer: Self-pay | Admitting: Urology

## 2020-08-31 ENCOUNTER — Telehealth: Payer: Self-pay | Admitting: Urology

## 2020-08-31 NOTE — Telephone Encounter (Signed)
Returned patient call. She had her 2nd stage bilateral USE/LL yesterday with placement of bilateral ureteral stents. Strings were left on her stents, and she was instructed to remove them this Tuesday.   Since her stents were placed, she reports significant incontinence of urine. She denies any fevers/chills and dysuria.   I discussed that incontinence with ureteral stents is not uncommon. She already has a prescription for oxybutynin. I discussed that there isn't much else to do and recommended trying to hold out until Tuesday when she removes her stents.

## 2021-05-26 ENCOUNTER — Emergency Department (HOSPITAL_BASED_OUTPATIENT_CLINIC_OR_DEPARTMENT_OTHER)
Admission: EM | Admit: 2021-05-26 | Discharge: 2021-05-27 | Disposition: A | Discharge status: Home / Self Care | Payer: BC Managed Care – PPO | Attending: Emergency Medicine | Admitting: Emergency Medicine

## 2021-05-26 ENCOUNTER — Other Ambulatory Visit: Payer: Self-pay

## 2021-05-26 ENCOUNTER — Encounter (HOSPITAL_BASED_OUTPATIENT_CLINIC_OR_DEPARTMENT_OTHER): Payer: Self-pay

## 2021-05-26 ENCOUNTER — Other Ambulatory Visit: Payer: Self-pay | Admitting: Nurse Practitioner

## 2021-05-26 DIAGNOSIS — Z7984 Long term (current) use of oral hypoglycemic drugs: Secondary | ICD-10-CM | POA: Diagnosis not present

## 2021-05-26 DIAGNOSIS — R1013 Epigastric pain: Secondary | ICD-10-CM | POA: Insufficient documentation

## 2021-05-26 DIAGNOSIS — Z1231 Encounter for screening mammogram for malignant neoplasm of breast: Secondary | ICD-10-CM

## 2021-05-26 DIAGNOSIS — J45909 Unspecified asthma, uncomplicated: Secondary | ICD-10-CM | POA: Diagnosis not present

## 2021-05-26 DIAGNOSIS — J0110 Acute frontal sinusitis, unspecified: Secondary | ICD-10-CM | POA: Insufficient documentation

## 2021-05-26 LAB — CBC
HCT: 40.7 % (ref 36.0–46.0)
Hemoglobin: 13.5 g/dL (ref 12.0–15.0)
MCH: 32.5 pg (ref 26.0–34.0)
MCHC: 33.2 g/dL (ref 30.0–36.0)
MCV: 98.1 fL (ref 80.0–100.0)
Platelets: 330 10*3/uL (ref 150–400)
RBC: 4.15 MIL/uL (ref 3.87–5.11)
RDW: 13.7 % (ref 11.5–15.5)
WBC: 9.4 10*3/uL (ref 4.0–10.5)
nRBC: 0 % (ref 0.0–0.2)

## 2021-05-26 LAB — COMPREHENSIVE METABOLIC PANEL
ALT: 43 U/L (ref 0–44)
AST: 49 U/L — ABNORMAL HIGH (ref 15–41)
Albumin: 4.2 g/dL (ref 3.5–5.0)
Alkaline Phosphatase: 59 U/L (ref 38–126)
Anion gap: 10 (ref 5–15)
BUN: 16 mg/dL (ref 6–20)
CO2: 26 mmol/L (ref 22–32)
Calcium: 8.9 mg/dL (ref 8.9–10.3)
Chloride: 103 mmol/L (ref 98–111)
Creatinine, Ser: 0.74 mg/dL (ref 0.44–1.00)
GFR, Estimated: >60 mL/min (ref >60)
Glucose, Bld: 111 mg/dL — ABNORMAL HIGH (ref 70–99)
Potassium: 3.8 mmol/L (ref 3.5–5.1)
Sodium: 139 mmol/L (ref 135–145)
Total Bilirubin: 0.4 mg/dL (ref 0.3–1.2)
Total Protein: 7.8 g/dL (ref 6.5–8.1)

## 2021-05-26 LAB — URINALYSIS, ROUTINE W REFLEX MICROSCOPIC
Bilirubin Urine: NEGATIVE
Glucose, UA: NEGATIVE mg/dL
Hgb urine dipstick: NEGATIVE
Ketones, ur: NEGATIVE mg/dL
Nitrite: NEGATIVE
Protein, ur: NEGATIVE mg/dL
Specific Gravity, Urine: 1.025 (ref 1.005–1.030)
pH: 6 (ref 5.0–8.0)

## 2021-05-26 LAB — URINALYSIS, MICROSCOPIC (REFLEX): RBC / HPF: NONE SEEN RBC/hpf (ref 0–5)

## 2021-05-26 LAB — TROPONIN I (HIGH SENSITIVITY): Troponin I (High Sensitivity): 2 ng/L (ref <18)

## 2021-05-26 LAB — LIPASE, BLOOD: Lipase: 44 U/L (ref 11–51)

## 2021-05-26 LAB — PREGNANCY, URINE: Preg Test, Ur: NEGATIVE

## 2021-05-26 MED ORDER — LIDOCAINE VISCOUS HCL 2 % MT SOLN
15.0000 mL | Freq: Once | OROMUCOSAL | Status: AC
  Administered 2021-05-26: 15 mL via ORAL
  Filled 2021-05-26: qty 15

## 2021-05-26 MED ORDER — ONDANSETRON 4 MG PO TBDP
4.0000 mg | ORAL_TABLET | Freq: Once | ORAL | Status: AC | PRN
  Administered 2021-05-26: 4 mg via ORAL
  Filled 2021-05-26: qty 1

## 2021-05-26 MED ORDER — FENTANYL CITRATE (PF) 100 MCG/2ML IJ SOLN: 50.0000 ug | INTRAMUSCULAR | Status: DC | PRN

## 2021-05-26 MED ORDER — ALUM & MAG HYDROXIDE-SIMETH 200-200-20 MG/5ML PO SUSP
30.0000 mL | Freq: Once | ORAL | Status: AC
  Administered 2021-05-26: 30 mL via ORAL
  Filled 2021-05-26: qty 30

## 2021-05-26 NOTE — ED Triage Notes (Signed)
Pt c/o abd pain, diarrhea x 30 min-NAD-steady gait

## 2021-05-26 NOTE — ED Provider Notes (Signed)
[facility] EMERGENCY DEPARTMENT Provider Note   CSN: [id number] Arrival date & time: 05/26/21  1927     History Chief Complaint  Patient presents with  . Abdominal Pain    Adriana Newman is a 50 y.o. female.  Shortly after eating patient had an acute onset of nausea and diaphoresis. She went to the bathroom and had disabling epigastric pain associated with the two. She then had diarrhea and shortly after the symptoms all resolved. She had another episode without the diarrhea here while in triage. No more diarrhea. No vomiting. No fevers. Recently has had a sinus infection but currently not on antibiotics and hasn't been otherwise sick recently. 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 Doesn't drink/smoke/drugs. No h/o GBD or pancreas problems.    Abdominal Pain      Past Medical History:  Diagnosis Date  . Depression   . GERD (gastroesophageal reflux disease)   . History of concussion    per pt as child with no residual  . IDA (iron deficiency anemia)   . OSA on CPAP 2012   per pt mild osa and uses every night  . PCOS (polycystic ovarian syndrome)    TAKES METFORMIN  . PONV (postoperative nausea and vomiting)    prefers to have scope patch  . Renal calculi    bilateral  . Seasonal asthma    allergy induced  . SUI (stress urinary incontinence, female)   . Wears contact lenses     There are no problems to display for this patient.   Past Surgical History:  Procedure Laterality Date  . BACK SURGERY  2002   L5-S1 fusion  . BREAST SURGERY  2010   reduction  . CESAREAN SECTION  2006  . CYSTOSCOPY/URETEROSCOPY/HOLMIUM LASER/STENT PLACEMENT Bilateral 08/20/2020   Procedure: CYSTOSCOPY/ RETROGRADES /URETEROSCOPY/HOLMIUM LASER LITHOTRIPSY /STENT PLACEMENT;  Surgeon: Jerilee Field, MD;  Location: [facility];  Service: Urology;  Laterality: Bilateral;  . HOLMIUM LASER APPLICATION Bilateral 08/20/2020   Procedure: HOLMIUM LASER  APPLICATION;  Surgeon: Jerilee Field, MD;  Location: [facility];  Service: Urology;  Laterality: Bilateral;  . KNEE ARTHROSCOPY W/ ACL RECONSTRUCTION  7989,2119   bilateral  . REDUCTION MAMMAPLASTY Bilateral 2009  . TOTAL KNEE ARTHROPLASTY Bilateral      OB History   No obstetric history on file.     No family history on file.  Social History   Tobacco Use  . Smoking status: Never Smoker  . Smokeless tobacco: Never Used  Vaping Use  . Vaping Use: Never used  Substance Use Topics  . Alcohol use: Not Currently  . Drug use: Never    Home Medications Prior to Admission medications   Medication Sig Start Date End Date Taking? Authorizing Provider  azithromycin (ZITHROMAX) 250 MG tablet Take 1 tablet (250 mg total) by mouth daily. Take first 2 tablets together, then 1 every day until finished. 05/27/21  Yes Harsha Yusko, Barbara Cower, MD  buPROPion (WELLBUTRIN XL) 300 MG 24 hr tablet Take 300 mg by mouth daily.    [provider]  CALCIUM PO Take 1 tablet by mouth daily.      [provider]  cetirizine (ZYRTEC) 10 MG tablet Take 10 mg by mouth at bedtime.    [provider]  Cholecalciferol (VITAMIN D) 2000 UNITS CAPS Take 1 capsule by mouth daily.      [provider]  DULoxetine (CYMBALTA) 30 MG capsule Take 90 mg by mouth daily.  [provider]  esomeprazole (NEXIUM) 20 MG capsule Take 20 mg by mouth daily at 12 noon.    [provider]  Ferrous Sulfate (IRON) 325 (65 FE) MG TABS Take 1 tablet by mouth daily.      [provider]  fluticasone (FLONASE) 50 MCG/ACT nasal spray Place 2 sprays into the nose daily.      [provider]  metFORMIN (GLUCOPHAGE) 1000 MG tablet Take 1,000 mg by mouth 2 (two) times daily with a meal.     [provider]  montelukast (SINGULAIR) 10 MG tablet Take 10 mg by mouth at bedtime.    [provider]  Multiple Vitamins-Minerals (MULTIVITAMINS THER.  W/MINERALS) TABS Take 1 tablet by mouth daily.      [provider]  nitrofurantoin (MACRODANTIN) 50 MG capsule Take 50 mg by mouth at bedtime.    [provider]  vitamin B-12 (CYANOCOBALAMIN) 1000 MCG tablet Take 1,000 mcg by mouth daily.    [provider]    Allergies    Adhesive [tape], Morphine and related, Oxycodone, and Penicillins  Review of Systems   Review of Systems  Gastrointestinal: Positive for abdominal pain.  All other systems reviewed and are negative.   Physical Exam Updated Vital Signs BP (!) 141/80 (BP Location: Right Arm)   Pulse 81   Temp 98.1 F (36.7 C) (Oral)   Resp 15   Ht 5\' 6"  (1.676 m)   Wt 113.4 kg   SpO2 99%   BMI 40.35 kg/m   Physical Exam Vitals and nursing note reviewed.  Constitutional:      Appearance: She is well-developed.  HENT:     Head: Normocephalic and atraumatic.     Nose: Nose normal. No congestion or rhinorrhea.     Mouth/Throat:     Mouth: Mucous membranes are moist.     Pharynx: Oropharynx is clear.  Eyes:     Pupils: Pupils are equal, round, and reactive to light.  Cardiovascular:     Rate and Rhythm: Normal rate and regular rhythm.  Pulmonary:     Effort: No respiratory distress.     Breath sounds: No stridor. No wheezing.  Abdominal:     General: Abdomen is flat. There is no distension.  Musculoskeletal:        General: No swelling or tenderness. Normal range of motion.     Cervical back: Normal range of motion.  Skin:    General: Skin is warm and dry.     Coloration: Skin is not jaundiced or pale.  Neurological:     General: No focal deficit present.     Mental Status: She is alert. Mental status is at baseline.     ED Results / Procedures / Treatments   Labs (all labs ordered are listed, but only abnormal results are displayed) Labs Reviewed  COMPREHENSIVE METABOLIC PANEL - Abnormal; Notable for the following components:      Result Value   Glucose, Bld 111 (*)    AST 49  (*)    All other components within normal limits  URINALYSIS, ROUTINE W REFLEX MICROSCOPIC - Abnormal; Notable for the following components:   APPearance HAZY (*)    Leukocytes,Ua TRACE (*)    All other components within normal limits  URINALYSIS, MICROSCOPIC (REFLEX) - Abnormal; Notable for the following components:   Bacteria, UA RARE (*)    All other components within normal limits  LIPASE, BLOOD  CBC  PREGNANCY, URINE  TROPONIN I (HIGH  SENSITIVITY)  TROPONIN I (HIGH SENSITIVITY)    EKG EKG Interpretation  Date/Time:  Monday May 26 2021 20:18:19 EDT Ventricular Rate:  94 PR Interval:  162 QRS Duration: 88 QT Interval:  376 QTC Calculation: 470 R Axis:   15 Text Interpretation: Normal sinus rhythm Normal ECG Confirmed by Marily Memos [id number]) on 05/26/2021 10:52:41 PM   Radiology No results found.  Procedures Procedures   Medications Ordered in ED Medications  fentaNYL (SUBLIMAZE) injection 50 mcg (has no administration in time range)  ondansetron (ZOFRAN-ODT) disintegrating tablet 4 mg (4 mg Oral Given 05/26/21 2017)  alum & mag hydroxide-simeth (MAALOX/MYLANTA) 200-200-20 MG/5ML suspension 30 mL (30 mLs Oral Given 05/26/21 2315)    And  lidocaine (XYLOCAINE) 2 % viscous mouth solution 15 mL (15 mLs Oral Given 05/26/21 2315)    ED Course  I have reviewed the triage vital signs and the nursing notes.  Pertinent labs & imaging results that were available during my care of the patient were reviewed by me and considered in my medical decision making (see chart for details).    MDM Rules/Calculators/A&P                          Very transient in nature. Onetime associated with diarrhea the other time it wasn't. Will check troponins but sounds like GI related. Troponins/ecg unremarkable. No return of pain in ED to suggest need for ct scan or other imaging.   Final Clinical Impression(s) / ED Diagnoses Final diagnoses:  Acute frontal sinusitis, recurrence not specified   Epigastric pain    Rx / DC Orders ED Discharge Orders         Ordered    azithromycin (ZITHROMAX) 250 MG tablet  Daily        05/27/21 0138           Raeshaun Simson, Barbara Cower, MD 05/27/21 [phone]

## 2021-05-27 LAB — TROPONIN I (HIGH SENSITIVITY): Troponin I (High Sensitivity): <2 ng/L (ref <18)

## 2021-05-27 MED ORDER — AZITHROMYCIN 250 MG PO TABS: 250.0000 mg | ORAL_TABLET | Freq: Every day | ORAL | 0 refills | Status: DC

## 2021-06-06 ENCOUNTER — Ambulatory Visit
Admission: RE | Admit: 2021-06-06 | Discharge: 2021-06-06 | Disposition: A | Discharge status: Home / Self Care | Payer: BC Managed Care – PPO | Source: Ambulatory Visit | Attending: Nurse Practitioner | Admitting: Nurse Practitioner

## 2021-06-06 ENCOUNTER — Other Ambulatory Visit: Payer: Self-pay

## 2021-06-06 DIAGNOSIS — Z1231 Encounter for screening mammogram for malignant neoplasm of breast: Secondary | ICD-10-CM

## 2023-06-02 ENCOUNTER — Ambulatory Visit
Admission: RE | Admit: 2023-06-02 | Discharge: 2023-06-02 | Disposition: A | Discharge status: Home / Self Care | Payer: BC Managed Care – PPO | Source: Ambulatory Visit | Attending: Family Medicine | Admitting: Family Medicine

## 2023-06-02 ENCOUNTER — Other Ambulatory Visit: Payer: Self-pay | Admitting: Family Medicine

## 2023-06-02 DIAGNOSIS — Z1231 Encounter for screening mammogram for malignant neoplasm of breast: Secondary | ICD-10-CM

## 2024-01-19 ENCOUNTER — Other Ambulatory Visit: Payer: Self-pay | Admitting: Urology

## 2024-01-20 ENCOUNTER — Encounter (HOSPITAL_BASED_OUTPATIENT_CLINIC_OR_DEPARTMENT_OTHER): Payer: Self-pay | Admitting: Urology

## 2024-01-20 NOTE — Progress Notes (Signed)
Spoke w/ via phone for pre-op interview--- Adriana Newman Lab needs dos----              UPT COVID test -----patient states asymptomatic no test needed Arrive at ------- 0800 NPO after MN NO Solid Food.  Clear liquids from MN until--- Sip of water with meds only Med rec completed. Pt aware to hold ASA/NSAIDs and supplements per [facility] protocol.  Medications to take morning of surgery ----- nexium, zyrtec, singulair, cymbalta, wellbutrin, tylenol prn Diabetic/Weight loss medication ----- will hold metformin dos No Alcohol or recreational drugs for 24 hours/Tobacco products for 6 hours.  Patient instructed to bring blue lithotripsy folder, photo id and insurance card day of surgery. Patient aware to have Driver (ride ) / caregiver for 24 hours after surgery ----- Adriana Newman (friend) Patient Special Instructions ----- bring CPAP Pre-Op special Instructions ----- take laxative of choice day before procedure Patient verbalized understanding of instructions that were given at this phone interview. Patient denies shortness of breath, chest pain, fever, cough at this phone interview.

## 2024-01-24 ENCOUNTER — Ambulatory Visit (HOSPITAL_COMMUNITY): Payer: 59

## 2024-01-24 ENCOUNTER — Other Ambulatory Visit: Payer: Self-pay

## 2024-01-24 ENCOUNTER — Encounter (HOSPITAL_BASED_OUTPATIENT_CLINIC_OR_DEPARTMENT_OTHER)
Admission: RE | Disposition: A | Discharge status: Home / Self Care | Payer: Self-pay | Source: Home / Self Care | Attending: Urology

## 2024-01-24 ENCOUNTER — Encounter (HOSPITAL_BASED_OUTPATIENT_CLINIC_OR_DEPARTMENT_OTHER): Payer: Self-pay | Admitting: Urology

## 2024-01-24 ENCOUNTER — Ambulatory Visit (HOSPITAL_BASED_OUTPATIENT_CLINIC_OR_DEPARTMENT_OTHER)
Admission: RE | Admit: 2024-01-24 | Discharge: 2024-01-24 | Disposition: A | Discharge status: Home / Self Care | Payer: 59 | Attending: Urology | Admitting: Urology

## 2024-01-24 DIAGNOSIS — G4730 Sleep apnea, unspecified: Secondary | ICD-10-CM | POA: Insufficient documentation

## 2024-01-24 DIAGNOSIS — E669 Obesity, unspecified: Secondary | ICD-10-CM | POA: Diagnosis not present

## 2024-01-24 DIAGNOSIS — Z01818 Encounter for other preprocedural examination: Secondary | ICD-10-CM

## 2024-01-24 DIAGNOSIS — N200 Calculus of kidney: Secondary | ICD-10-CM | POA: Diagnosis present

## 2024-01-24 HISTORY — PX: EXTRACORPOREAL SHOCK WAVE LITHOTRIPSY: SHX1557

## 2024-01-24 LAB — POCT PREGNANCY, URINE: Preg Test, Ur: NEGATIVE

## 2024-01-24 SURGERY — LITHOTRIPSY, ESWL
Anesthesia: LOCAL | Laterality: Right

## 2024-01-24 MED ORDER — DIPHENHYDRAMINE HCL 25 MG PO CAPS
25.0000 mg | ORAL_CAPSULE | ORAL | Status: AC
  Administered 2024-01-24: 25 mg via ORAL

## 2024-01-24 MED ORDER — SODIUM CHLORIDE 0.9 % IV SOLN: INTRAVENOUS | Status: DC

## 2024-01-24 MED ORDER — DIAZEPAM 5 MG PO TABS
ORAL_TABLET | ORAL | Status: AC
  Filled 2024-01-24: qty 2

## 2024-01-24 MED ORDER — CIPROFLOXACIN HCL 500 MG PO TABS
500.0000 mg | ORAL_TABLET | ORAL | Status: AC
  Administered 2024-01-24: 500 mg via ORAL

## 2024-01-24 MED ORDER — TRAMADOL HCL 50 MG PO TABS: 50.0000 mg | ORAL_TABLET | Freq: Four times a day (QID) | ORAL | 0 refills | Status: DC | PRN

## 2024-01-24 MED ORDER — DIPHENHYDRAMINE HCL 25 MG PO CAPS
ORAL_CAPSULE | ORAL | Status: AC
Start: 2024-01-24 — End: ?
  Filled 2024-01-24: qty 1

## 2024-01-24 MED ORDER — DIAZEPAM 5 MG PO TABS
10.0000 mg | ORAL_TABLET | ORAL | Status: AC
  Administered 2024-01-24: 10 mg via ORAL

## 2024-01-24 MED ORDER — CIPROFLOXACIN HCL 500 MG PO TABS
ORAL_TABLET | ORAL | Status: AC
  Filled 2024-01-24: qty 1

## 2024-01-24 NOTE — Op Note (Signed)
 See [employer] OP note scanned into chart. Also because of the size, density, location and other factors that cannot be anticipated I feel this will likely be a staged procedure. This fact supersedes any indication in the scanned [facility] operative note to the contrary.

## 2024-01-24 NOTE — Discharge Instructions (Addendum)

## 2024-01-24 NOTE — H&P (Signed)
 See scanned H&P

## 2024-01-25 ENCOUNTER — Encounter (HOSPITAL_BASED_OUTPATIENT_CLINIC_OR_DEPARTMENT_OTHER): Payer: Self-pay | Admitting: Urology

## 2024-07-03 ENCOUNTER — Other Ambulatory Visit: Payer: Self-pay | Admitting: Family Medicine

## 2024-07-03 DIAGNOSIS — Z1231 Encounter for screening mammogram for malignant neoplasm of breast: Secondary | ICD-10-CM

## 2024-07-06 ENCOUNTER — Ambulatory Visit
Admission: RE | Admit: 2024-07-06 | Discharge: 2024-07-06 | Disposition: A | Discharge status: Home / Self Care | Source: Ambulatory Visit

## 2024-07-06 DIAGNOSIS — Z1231 Encounter for screening mammogram for malignant neoplasm of breast: Secondary | ICD-10-CM

## 2024-09-06 ENCOUNTER — Telehealth: Payer: Self-pay

## 2024-09-06 ENCOUNTER — Encounter: Payer: Self-pay | Admitting: Psychiatry

## 2024-09-06 NOTE — Telephone Encounter (Signed)
 Spoke with Adriana Newman regarding her referral to GYN oncology. She has an appointment scheduled with Dr. Eldonna on 9/22 at 10/:30. Patient agrees to date and time. She has been provided with office address and location. She is also aware of our mask and visitor policy. Patient verbalized understanding and will call with any questions.

## 2024-09-08 NOTE — Patient Instructions (Addendum)
 Preparing for your Surgery  Plan for surgery on October 17, 2024 with Dr. Hoy Masters at [facility]. You will be scheduled for robotic assisted total laparoscopic hysterectomy (removal of the uterus and cervix), bilateral salpingo-oophorectomy (removal of both ovaries and fallopian tubes), sentinel lymph node biopsy, possible lymph node dissection, possible laparotomy (larger incision on your abdomen if needed).  Dr. Masters has sent in provera  to start taking now. If your migraine worsen, you can stop this medication.  Pre-operative Testing -You will receive a phone call from presurgical testing at [facility] to arrange for a pre-operative appointment and lab work.  -Bring your insurance card, copy of an advanced directive if applicable, medication list  -At that visit, you will be asked to sign a consent for a possible blood transfusion in case a transfusion becomes necessary during surgery.  The need for a blood transfusion is rare but having consent is a necessary part of your care.     -You should not be taking blood thinners or aspirin at least ten days prior to surgery unless instructed by your surgeon.  -Do not take supplements such as fish oil (omega 3), red yeast rice, turmeric before your surgery. STOP TAKING AT LEAST 10 DAYS BEFORE SURGERY. You want to avoid medications with aspirin in them including headache powders such as BC or Goody's), Excedrin migraine.  -If you are taking a GLP-1 medication/injection such as Ozempic, Mounjaro, [id number], this needs to be held before surgery for at least 7 days before.  Day Before Surgery at Home -You will be asked to take in a light diet the day before surgery. You will be advised you can have clear liquids up until 3 hours before your surgery.    Eat a light diet the day before surgery.  Examples including soups, broths, toast, yogurt, mashed potatoes.  AVOID GAS PRODUCING FOODS AND BEVERAGES. Things to avoid include  carbonated beverages (fizzy beverages, sodas), raw fruits and raw vegetables (uncooked), or beans.   If your bowels are filled with gas, your surgeon will have difficulty visualizing your pelvic organs which increases your surgical risks.  Your role in recovery Your role is to become active as soon as directed by your doctor, while still giving yourself time to heal.  Rest when you feel tired. You will be asked to do the following in order to speed your recovery:  - Cough and breathe deeply. This helps to clear and expand your lungs and can prevent pneumonia after surgery.  - STAY ACTIVE WHEN YOU GET HOME. Do mild physical activity. Walking or moving your legs help your circulation and body functions return to normal. Do not try to get up or walk alone the first time after surgery.   -If you develop swelling on one leg or the other, pain in the back of your leg, redness/warmth in one of your legs, please call the office or go to the Emergency Room to have a doppler to rule out a blood clot. For shortness of breath, chest pain-seek care in the Emergency Room as soon as possible. - Actively manage your pain. Managing your pain lets you move in comfort. We will ask you to rate your pain on a scale of zero to 10. It is your responsibility to tell your doctor or nurse where and how much you hurt so your pain can be treated.  Special Considerations -If you are diabetic, you may be placed on insulin after surgery to have closer control over  your blood sugars to promote healing and recovery.  This does not mean that you will be discharged on insulin.  If applicable, your oral antidiabetics will be resumed when you are tolerating a solid diet.  -Your final pathology results from surgery should be available around one week after surgery and the results will be relayed to you when available.  -Dr. Olam Mill is the surgeon that assists your GYN Oncologist with surgery.  If you end up staying the night,  the next day after your surgery you will either see Dr. Viktoria, Dr. Eldonna, or Dr. Olam Mill.  -FMLA forms can be faxed to [phone] and please allow 5-7 business days for completion.  Pain Management After Surgery -You will be prescribed your pain medication and bowel regimen medications before surgery so that you can have these available when you are discharged from the hospital. The pain medication is for use ONLY AFTER surgery and a new prescription will not be given.   -Make sure that you have Tylenol  and Ibuprofen IF YOU ARE ABLE TO TAKE THESE MEDICATIONS at home to use on a regular basis after surgery for pain control. We recommend alternating the medications every hour to six hours since they work differently and are processed in the body differently for pain relief.  -Review the attached handout on narcotic use and their risks and side effects.   Bowel Regimen -You will be prescribed Sennakot-S to take nightly to prevent constipation especially if you are taking the narcotic pain medication intermittently.  It is important to prevent constipation and drink adequate amounts of liquids. You can stop taking this medication when you are not taking pain medication and you are back on your normal bowel routine.  Risks of Surgery Risks of surgery are low but include bleeding, infection, damage to surrounding structures, re-operation, blood clots, and very rarely death.   Blood Transfusion Information (For the consent to be signed before surgery)  We will be checking your blood type before surgery so in case of emergencies, we will know what type of blood you would need.                                            WHAT IS A BLOOD TRANSFUSION?  A transfusion is the replacement of blood or some of its parts. Blood is made up of multiple cells which provide different functions. Red blood cells carry oxygen and are used for blood loss replacement. White blood cells fight against  infection. Platelets control bleeding. Plasma helps clot blood. Other blood products are available for specialized needs, such as hemophilia or other clotting disorders. BEFORE THE TRANSFUSION  Who gives blood for transfusions?  You may be able to donate blood to be used at a later date on yourself (autologous donation). Relatives can be asked to donate blood. This is generally not any safer than if you have received blood from a stranger. The same precautions are taken to ensure safety when a relative's blood is donated. Healthy volunteers who are fully evaluated to make sure their blood is safe. This is blood bank blood. Transfusion therapy is the safest it has ever been in the practice of medicine. Before blood is taken from a donor, a complete history is taken to make sure that person has no history of diseases nor engages in risky social behavior (examples are intravenous drug use  or sexual activity with multiple partners). The donor's travel history is screened to minimize risk of transmitting infections, such as malaria. The donated blood is tested for signs of infectious diseases, such as HIV and hepatitis. The blood is then tested to be sure it is compatible with you in order to minimize the chance of a transfusion reaction. If you or a relative donates blood, this is often done in anticipation of surgery and is not appropriate for emergency situations. It takes many days to process the donated blood. RISKS AND COMPLICATIONS Although transfusion therapy is very safe and saves many lives, the main dangers of transfusion include:  Getting an infectious disease. Developing a transfusion reaction. This is an allergic reaction to something in the blood you were given. Every precaution is taken to prevent this. The decision to have a blood transfusion has been considered carefully by your caregiver before blood is given. Blood is not given unless the benefits outweigh the risks.  AFTER SURGERY  INSTRUCTIONS  Return to work: 4-6 weeks if applicable  Activity: 1. Be up and out of the bed during the day.  Take a nap if needed.  You may walk up steps but be careful and use the hand rail.  Stair climbing will tire you more than you think, you may need to stop part way and rest.   2. No lifting or straining for 6 weeks over 10 pounds. No pushing, pulling, straining for 6 weeks.  3. No driving for 4-89 days when the following criteria have been met: Do not drive if you are taking narcotic pain medicine and make sure that your reaction time has returned.   4. You can shower as soon as the next day after surgery. Shower daily.  Use your regular soap and water (not directly on the incision) and pat your incision(s) dry afterwards; don't rub.  No tub baths or submerging your body in water until cleared by your surgeon. If you have the soap that was given to you by pre-surgical testing that was used before surgery, you do not need to use it afterwards because this can irritate your incisions.   5. No sexual activity and nothing in the vagina for 12 weeks.  6. You may experience a small amount of clear drainage from your incisions, which is normal.  If the drainage persists, increases, or changes color please call the office.  7. Do not use creams, lotions, or ointments such as neosporin on your incisions after surgery until advised by your surgeon because they can cause removal of the dermabond glue on your incisions.    8. You may experience vaginal spotting after surgery or when the stitches at the top of the vagina begin to dissolve.  The spotting is normal but if you experience heavy bleeding, call our office.  9. Take Tylenol  or ibuprofen first for pain if you are able to take these medications and only use narcotic pain medication for severe pain not relieved by the Tylenol  or Ibuprofen.  Monitor your Tylenol  intake to a max of 4,000 mg in a 24 hour period. You can alternate these  medications after surgery.  Diet: 1. Low sodium Heart Healthy Diet is recommended but you are cleared to resume your normal (before surgery) diet after your procedure.  2. It is safe to use a laxative, such as Miralax or Colace, if you have difficulty moving your bowels before surgery. You have been prescribed Sennakot-S to take at bedtime every evening after surgery  to keep bowel movements regular and to prevent constipation.    Wound Care: 1. Keep clean and dry.  Shower daily.  Reasons to call the Doctor: Fever - Oral temperature greater than 100.4 degrees Fahrenheit Foul-smelling vaginal discharge Difficulty urinating Nausea and vomiting Increased pain at the site of the incision that is unrelieved with pain medicine. Difficulty breathing with or without chest pain New calf pain especially if only on one side Sudden, continuing increased vaginal bleeding with or without clots.   Contacts: For questions or concerns you should contact:  Dr. Hoy Masters at [phone]  Eleanor Epps, NP at [phone]  After Hours: call [phone] and have the GYN Oncologist paged/contacted (after 5 pm or on the weekends). You will speak with an after hours RN and let he or she know you have had surgery.  Messages sent via mychart are for non-urgent matters and are not responded to after hours so for urgent needs, please call the after hours number.

## 2024-09-11 ENCOUNTER — Inpatient Hospital Stay: Attending: Psychiatry | Admitting: Psychiatry

## 2024-09-11 ENCOUNTER — Encounter: Payer: Self-pay | Admitting: Psychiatry

## 2024-09-11 ENCOUNTER — Telehealth: Payer: Self-pay

## 2024-09-11 ENCOUNTER — Inpatient Hospital Stay (HOSPITAL_BASED_OUTPATIENT_CLINIC_OR_DEPARTMENT_OTHER): Admitting: Gynecologic Oncology

## 2024-09-11 VITALS — BP 110/73 | HR 76 | Temp 98.5°F | Resp 19 | Ht 67.0 in | Wt 230.6 lb

## 2024-09-11 DIAGNOSIS — F32A Depression, unspecified: Secondary | ICD-10-CM | POA: Diagnosis not present

## 2024-09-11 DIAGNOSIS — C541 Malignant neoplasm of endometrium: Secondary | ICD-10-CM | POA: Insufficient documentation

## 2024-09-11 DIAGNOSIS — K219 Gastro-esophageal reflux disease without esophagitis: Secondary | ICD-10-CM | POA: Diagnosis not present

## 2024-09-11 DIAGNOSIS — E282 Polycystic ovarian syndrome: Secondary | ICD-10-CM | POA: Diagnosis not present

## 2024-09-11 DIAGNOSIS — N950 Postmenopausal bleeding: Secondary | ICD-10-CM | POA: Diagnosis not present

## 2024-09-11 DIAGNOSIS — D509 Iron deficiency anemia, unspecified: Secondary | ICD-10-CM | POA: Insufficient documentation

## 2024-09-11 DIAGNOSIS — G4733 Obstructive sleep apnea (adult) (pediatric): Secondary | ICD-10-CM | POA: Diagnosis not present

## 2024-09-11 DIAGNOSIS — Z87442 Personal history of urinary calculi: Secondary | ICD-10-CM | POA: Diagnosis not present

## 2024-09-11 MED ORDER — MEDROXYPROGESTERONE ACETATE 10 MG PO TABS: 10.0000 mg | ORAL_TABLET | Freq: Every day | ORAL | 0 refills | Status: DC

## 2024-09-11 MED ORDER — SENNOSIDES-DOCUSATE SODIUM 8.6-50 MG PO TABS: 2.0000 | ORAL_TABLET | Freq: Every day | ORAL | 0 refills | Status: AC

## 2024-09-11 MED ORDER — TRAMADOL HCL 50 MG PO TABS: 50.0000 mg | ORAL_TABLET | Freq: Four times a day (QID) | ORAL | 0 refills | Status: AC | PRN

## 2024-09-11 NOTE — Patient Instructions (Signed)
 Preparing for your Surgery  Plan for surgery on October 17, 2024 with Dr. Hoy Masters at [facility]. You will be scheduled for robotic assisted total laparoscopic hysterectomy (removal of the uterus and cervix), bilateral salpingo-oophorectomy (removal of both ovaries and fallopian tubes), sentinel lymph node biopsy, possible lymph node dissection, possible laparotomy (larger incision on your abdomen if needed).  Dr. Masters has sent in provera  to start taking now. If your migraine worsen, you can stop this medication.  Pre-operative Testing -You will receive a phone call from presurgical testing at [facility] to arrange for a pre-operative appointment and lab work.  -Bring your insurance card, copy of an advanced directive if applicable, medication list  -At that visit, you will be asked to sign a consent for a possible blood transfusion in case a transfusion becomes necessary during surgery.  The need for a blood transfusion is rare but having consent is a necessary part of your care.     -You should not be taking blood thinners or aspirin at least ten days prior to surgery unless instructed by your surgeon.  -Do not take supplements such as fish oil (omega 3), red yeast rice, turmeric before your surgery. STOP TAKING AT LEAST 10 DAYS BEFORE SURGERY. You want to avoid medications with aspirin in them including headache powders such as BC or Goody's), Excedrin migraine.  -If you are taking a GLP-1 medication/injection such as Ozempic, Mounjaro, [id number], this needs to be held before surgery for at least 7 days before.  Day Before Surgery at Home -You will be asked to take in a light diet the day before surgery. You will be advised you can have clear liquids up until 3 hours before your surgery.    Eat a light diet the day before surgery.  Examples including soups, broths, toast, yogurt, mashed potatoes.  AVOID GAS PRODUCING FOODS AND BEVERAGES. Things to avoid include  carbonated beverages (fizzy beverages, sodas), raw fruits and raw vegetables (uncooked), or beans.   If your bowels are filled with gas, your surgeon will have difficulty visualizing your pelvic organs which increases your surgical risks.  Your role in recovery Your role is to become active as soon as directed by your doctor, while still giving yourself time to heal.  Rest when you feel tired. You will be asked to do the following in order to speed your recovery:  - Cough and breathe deeply. This helps to clear and expand your lungs and can prevent pneumonia after surgery.  - STAY ACTIVE WHEN YOU GET HOME. Do mild physical activity. Walking or moving your legs help your circulation and body functions return to normal. Do not try to get up or walk alone the first time after surgery.   -If you develop swelling on one leg or the other, pain in the back of your leg, redness/warmth in one of your legs, please call the office or go to the Emergency Room to have a doppler to rule out a blood clot. For shortness of breath, chest pain-seek care in the Emergency Room as soon as possible. - Actively manage your pain. Managing your pain lets you move in comfort. We will ask you to rate your pain on a scale of zero to 10. It is your responsibility to tell your doctor or nurse where and how much you hurt so your pain can be treated.  Special Considerations -If you are diabetic, you may be placed on insulin after surgery to have closer control over  your blood sugars to promote healing and recovery.  This does not mean that you will be discharged on insulin.  If applicable, your oral antidiabetics will be resumed when you are tolerating a solid diet.  -Your final pathology results from surgery should be available around one week after surgery and the results will be relayed to you when available.  -Dr. Olam Mill is the surgeon that assists your GYN Oncologist with surgery.  If you end up staying the night,  the next day after your surgery you will either see Dr. Viktoria, Dr. Eldonna, or Dr. Olam Mill.  -FMLA forms can be faxed to [phone] and please allow 5-7 business days for completion.  Pain Management After Surgery -You will be prescribed your pain medication and bowel regimen medications before surgery so that you can have these available when you are discharged from the hospital. The pain medication is for use ONLY AFTER surgery and a new prescription will not be given.   -Make sure that you have Tylenol  and Ibuprofen IF YOU ARE ABLE TO TAKE THESE MEDICATIONS at home to use on a regular basis after surgery for pain control. We recommend alternating the medications every hour to six hours since they work differently and are processed in the body differently for pain relief.  -Review the attached handout on narcotic use and their risks and side effects.   Bowel Regimen -You will be prescribed Sennakot-S to take nightly to prevent constipation especially if you are taking the narcotic pain medication intermittently.  It is important to prevent constipation and drink adequate amounts of liquids. You can stop taking this medication when you are not taking pain medication and you are back on your normal bowel routine.  Risks of Surgery Risks of surgery are low but include bleeding, infection, damage to surrounding structures, re-operation, blood clots, and very rarely death.   Blood Transfusion Information (For the consent to be signed before surgery)  We will be checking your blood type before surgery so in case of emergencies, we will know what type of blood you would need.                                            WHAT IS A BLOOD TRANSFUSION?  A transfusion is the replacement of blood or some of its parts. Blood is made up of multiple cells which provide different functions. Red blood cells carry oxygen and are used for blood loss replacement. White blood cells fight against  infection. Platelets control bleeding. Plasma helps clot blood. Other blood products are available for specialized needs, such as hemophilia or other clotting disorders. BEFORE THE TRANSFUSION  Who gives blood for transfusions?  You may be able to donate blood to be used at a later date on yourself (autologous donation). Relatives can be asked to donate blood. This is generally not any safer than if you have received blood from a stranger. The same precautions are taken to ensure safety when a relative's blood is donated. Healthy volunteers who are fully evaluated to make sure their blood is safe. This is blood bank blood. Transfusion therapy is the safest it has ever been in the practice of medicine. Before blood is taken from a donor, a complete history is taken to make sure that person has no history of diseases nor engages in risky social behavior (examples are intravenous drug use  or sexual activity with multiple partners). The donor's travel history is screened to minimize risk of transmitting infections, such as malaria. The donated blood is tested for signs of infectious diseases, such as HIV and hepatitis. The blood is then tested to be sure it is compatible with you in order to minimize the chance of a transfusion reaction. If you or a relative donates blood, this is often done in anticipation of surgery and is not appropriate for emergency situations. It takes many days to process the donated blood. RISKS AND COMPLICATIONS Although transfusion therapy is very safe and saves many lives, the main dangers of transfusion include:  Getting an infectious disease. Developing a transfusion reaction. This is an allergic reaction to something in the blood you were given. Every precaution is taken to prevent this. The decision to have a blood transfusion has been considered carefully by your caregiver before blood is given. Blood is not given unless the benefits outweigh the risks.  AFTER SURGERY  INSTRUCTIONS  Return to work: 4-6 weeks if applicable  Activity: 1. Be up and out of the bed during the day.  Take a nap if needed.  You may walk up steps but be careful and use the hand rail.  Stair climbing will tire you more than you think, you may need to stop part way and rest.   2. No lifting or straining for 6 weeks over 10 pounds. No pushing, pulling, straining for 6 weeks.  3. No driving for 4-89 days when the following criteria have been met: Do not drive if you are taking narcotic pain medicine and make sure that your reaction time has returned.   4. You can shower as soon as the next day after surgery. Shower daily.  Use your regular soap and water (not directly on the incision) and pat your incision(s) dry afterwards; don't rub.  No tub baths or submerging your body in water until cleared by your surgeon. If you have the soap that was given to you by pre-surgical testing that was used before surgery, you do not need to use it afterwards because this can irritate your incisions.   5. No sexual activity and nothing in the vagina for 12 weeks.  6. You may experience a small amount of clear drainage from your incisions, which is normal.  If the drainage persists, increases, or changes color please call the office.  7. Do not use creams, lotions, or ointments such as neosporin on your incisions after surgery until advised by your surgeon because they can cause removal of the dermabond glue on your incisions.    8. You may experience vaginal spotting after surgery or when the stitches at the top of the vagina begin to dissolve.  The spotting is normal but if you experience heavy bleeding, call our office.  9. Take Tylenol  or ibuprofen first for pain if you are able to take these medications and only use narcotic pain medication for severe pain not relieved by the Tylenol  or Ibuprofen.  Monitor your Tylenol  intake to a max of 4,000 mg in a 24 hour period. You can alternate these  medications after surgery.  Diet: 1. Low sodium Heart Healthy Diet is recommended but you are cleared to resume your normal (before surgery) diet after your procedure.  2. It is safe to use a laxative, such as Miralax or Colace, if you have difficulty moving your bowels before surgery. You have been prescribed Sennakot-S to take at bedtime every evening after surgery  to keep bowel movements regular and to prevent constipation.    Wound Care: 1. Keep clean and dry.  Shower daily.  Reasons to call the Doctor: Fever - Oral temperature greater than 100.4 degrees Fahrenheit Foul-smelling vaginal discharge Difficulty urinating Nausea and vomiting Increased pain at the site of the incision that is unrelieved with pain medicine. Difficulty breathing with or without chest pain New calf pain especially if only on one side Sudden, continuing increased vaginal bleeding with or without clots.   Contacts: For questions or concerns you should contact:  Dr. Hoy Masters at [phone]  Eleanor Epps, NP at [phone]  After Hours: call [phone] and have the GYN Oncologist paged/contacted (after 5 pm or on the weekends). You will speak with an after hours RN and let he or she know you have had surgery.  Messages sent via mychart are for non-urgent matters and are not responded to after hours so for urgent needs, please call the after hours number.

## 2024-09-11 NOTE — Progress Notes (Signed)
 Patient here for new patient consultation with Dr. Eldonna and for a pre-operative appointment prior to her scheduled surgery on 10/17/2024. She is scheduled for robotic assisted total laparoscopic hysterectomy, bilateral salpingo-oophorectomy, sentinel lymph node biopsy, possible lymph node dissection, possible laparotomy. The surgery was discussed in detail.  See after visit summary for additional details.    Discussed post-op pain management in detail including the aspects of the enhanced recovery pathway.  Advised her that a new prescription would be sent in for tramadol  and it is only to be used for after her upcoming surgery.  We discussed the use of tylenol  post-op and to monitor for a maximum of 4,000 mg in a 24 hour period.  Also prescribed sennakot to be used after surgery and to hold if having loose stools.  Discussed bowel regimen in detail.     Discussed measures to take at home to prevent DVT including frequent mobility.  Reportable signs and symptoms of DVT discussed. Post-operative instructions discussed and expectations for after surgery. Incisional care discussed as well including reportable signs and symptoms including erythema, drainage, wound separation.     10 minutes spent preparing information and with the patient.  Verbalizing understanding of material discussed. No needs or concerns voiced at the end of the visit.   Advised patient to call for any needs.  Advised that her post-operative medications had been prescribed and could be picked up at any time.    This appointment is included in the global surgical bundle as pre-operative teaching and has no charge.

## 2024-09-11 NOTE — Telephone Encounter (Signed)
 Office received,from [facility], a 90 supply request of Medroxyprogesterone  10mg . Per Dr.Newton 90 day supply is not needed. Pt only taking to get her to upcoming surgery on 10/28.  Request faxed back to pharmacy w/denial of 90 day supply.

## 2024-09-11 NOTE — Progress Notes (Signed)
 GYNECOLOGIC ONCOLOGY NEW PATIENT CONSULTATION  Date of Service: 09/11/2024 Referring Provider: Cleotilde Planas, MD [address] [place],  [place] [address]   ASSESSMENT AND PLAN: Adriana Newman is a 53 y.o. woman with FIGO grade 1 endometrioid endometrial cancer.  We reviewed the nature of endometrial cancer and its recommended surgical staging, including total hysterectomy, bilateral salpingo-oophorectomy, and lymph node assessment. The patient is a suitable candidate for staging via a minimally invasive approach to surgery.  We reviewed that robotic assistance would be used to complete the surgery.  We discussed that most endometrial cancer is detected early and that decisions regarding adjuvant therapy will be made based on her final pathology.   We reviewed the sentinel lymph node technique. Risks and benefits of sentinel lymph node biopsy was reviewed. We reviewed the technique and ICG dye. The patient DOES NOT have an iodine  allergy or known liver dysfunction. We reviewed the false negative rate (0.4%), and that 3% of patients with metastatic disease will not have it detected by SLN biopsy in endometrial cancer. A low risk of allergic reaction to the dye, <0.2% for ICG, has been reported. We also discussed that in the case of failed mapping, which occurs 40% of the time, a bilateral or unilateral lymphadenectomy will be performed at the surgeon's discretion.   Potential benefits of sentinel nodes including a higher detection rate for metastasis due to ultrastaging and potential reduction in operative morbidity. However, there remains uncertainty as to the role for treatment of micrometastatic disease. Further, the benefit of operative morbidity associated with the SLN technique in endometrial cancer is not yet completely known. In other patient populations (e.g. the cervical cancer population) there has been observed reductions in morbidity with SLN biopsy compared to pelvic lymphadenectomy.  Lymphedema, nerve dysfunction and lymphocysts are all potential risks with the SLN technique as with complete lymphadenectomy. Additional risks to the patient include the risk of damage to an internal organ while operating in an altered view (e.g. the black and white image of the robotic fluorescence imaging mode).   Patient was consented for: Robotic assisted total laparoscopic hysterectomy, bilateral salpingo-oophorectomy, sentinel lymph node evaluation and biopsy, possible lymph node dissection on 10/17/24. Sooner date offered, but pt traveling for nephew's wedding on 10/25.   The risks of surgery were discussed in detail and she understands these to including but not limited to bleeding requiring a blood transfusion, infection, injury to adjacent organs (including but not limited to the bowels, bladder, ureters, nerves, blood vessels), thromboembolic events, wound separation, hernia, vaginal cuff separation, possible risk of lymphedema and lymphocyst if lymphadenectomy performed, unforseen complication, and possible need for re-exploration.  If the patient experiences any of these events, she understands that her hospitalization or recovery may be prolonged and that she may need to take additional medications for a prolonged period. The patient will receive DVT and antibiotic prophylaxis as indicated. She voiced a clear understanding. She had the opportunity to ask questions and informed consent was obtained today. She wishes to proceed.  Rx for provera  sent given time to surgery. If worsens headache, okay to discontinue.  She does not require preoperative clearance. Her METs are >4.  All preoperative instructions were reviewed. Postoperative expectations were also reviewed. Written handouts were provided to the patient.  Pt currently lives in [place].  Reviewed that following postoperative care we may be able to transition her to her surveillance in [place] if desired.  A copy of this  note was sent to  the patient's referring provider.  Hoy Masters, MD Gynecologic Oncology   Medical Decision Making I personally spent  TOTAL 60 minutes face-to-face and non-face-to-face in the care of this patient, which includes all pre, intra, and post visit time on the date of service.   ------------  CC: endometrial cancer  HISTORY OF PRESENT ILLNESS:  Adriana Newman is a 53 y.o. woman who is seen in consultation at the request of Cleotilde Planas, MD for evaluation of FIGO grade 1 endometrial cancer.  Patient presented to provider on 08/16/2024 for an annual exam.  At that time she had noted she had gone 13 months without a period until 07/25/2024 when she experienced a light period for 4 days followed by heavy vaginal bleeding today.  She reported being under the care of an OB/GYN and had been performing biopsies and ultrasounds which were reportedly normal.  A Pap smear was collected on 08/16/2024 which was NILM, HPV HR negative.  An endometrial biopsy was also obtained which returned with FIGO grade 1 endometrioid endometrial cancer, MMRp.  On review of chart, I do see an endometrial biopsy from 2018 which was benign.  I do not see any more recent biopsies.  Today patient presents along with her friend.  She endorses the history as above.  She reports that she once went 2 years without a period, then resumed periods for 1 year, then went 13 months without a period prior to the bleeding now.  No bleeding today.  When it occurs it is overall light with some occasional tissue.  She also notes some possible abdominal bloating but thinks this might be related to starting her Qulipta.  Otherwise denies early satiety, significant weight loss, change in bowel or bladder habits.   Patient currently lives in [place] to help care for her elderly mother.  She reports that she has lived in [place] for most of her life so all of her doctors are still here.   PAST MEDICAL HISTORY: Past  Medical History:  Diagnosis Date   Depression    GERD (gastroesophageal reflux disease)    History of concussion    per pt as child with no residual   IDA (iron deficiency anemia)    Migraine headache    OSA on CPAP 2012   per pt mild osa and uses every night   PCOS (polycystic ovarian syndrome)    TAKES METFORMIN    PONV (postoperative nausea and vomiting)    prefers to have scope patch   Renal calculi    bilateral   Seasonal asthma    allergy induced   SUI (stress urinary incontinence, female)    Wears contact lenses     PAST SURGICAL HISTORY: Past Surgical History:  Procedure Laterality Date   BACK SURGERY  2002   L5-S1 fusion   BREAST SURGERY  2010   reduction   CESAREAN SECTION  2006   CYSTOSCOPY/URETEROSCOPY/HOLMIUM LASER/STENT PLACEMENT Bilateral 08/20/2020   Procedure: CYSTOSCOPY/ RETROGRADES /URETEROSCOPY/HOLMIUM LASER LITHOTRIPSY /STENT PLACEMENT;  Surgeon: Nieves Cough, MD;  Location: [facility];  Service: Urology;  Laterality: Bilateral;   EXTRACORPOREAL SHOCK WAVE LITHOTRIPSY Right 01/24/2024   Procedure: RIGHT EXTRACORPOREAL SHOCK WAVE LITHOTRIPSY (ESWL);  Surgeon: Carolee Sherwood JONETTA DOUGLAS, MD;  Location: [facility];  Service: Urology;  Laterality: Right;  75 MINUTE CASE   HOLMIUM LASER APPLICATION Bilateral 08/20/2020   Procedure: HOLMIUM LASER APPLICATION;  Surgeon: Nieves Cough, MD;  Location: [facility];  Service: Urology;  Laterality:  Bilateral;   KNEE ARTHROSCOPY W/ ACL RECONSTRUCTION  8010,8003   bilateral   REDUCTION MAMMAPLASTY Bilateral 2009   TOTAL KNEE ARTHROPLASTY Bilateral     OB/GYN HISTORY: OB History  Gravida Para Term Preterm AB Living  2 2 0 2  0  SAB IAB Ectopic Multiple Live Births     2 7    # Outcome Date GA Lbr Len/2nd Weight Sex Type Anes PTL Lv  2A Preterm      Vag-Spont   ND  2B Preterm      Vag-Spont   ND  2C Preterm      Vag-Spont   ND  2D Preterm      Vag-Spont   ND  1A  Preterm      CS-Unspec   ND  1B Preterm      CS-Unspec   ND  1C Preterm      CS-Unspec   ND      Age at menarche: 22 Age at menopause: 48 Hx of HRT: no Hx of STI: no Last pap: 2025 NILM, HPV HR neg History of abnormal pap smears: no  SCREENING STUDIES:  Last mammogram: 06/2024 Last colonoscopy: 2021 (due now for follow-up)  MEDICATIONS:  Current Outpatient Medications:    Atogepant (QULIPTA PO), Take by mouth., Disp: , Rfl:    buPROPion (WELLBUTRIN XL) 300 MG 24 hr tablet, Take 300 mg by mouth daily., Disp: , Rfl:    CALCIUM PO, Take 1 tablet by mouth daily.  , Disp: , Rfl:    cetirizine (ZYRTEC) 10 MG tablet, Take 10 mg by mouth at bedtime., Disp: , Rfl:    Cholecalciferol  (VITAMIN D) 2000 UNITS CAPS, Take 1 capsule by mouth daily.  , Disp: , Rfl:    DULoxetine  (CYMBALTA ) 60 MG capsule, Take 60 mg by mouth daily., Disp: , Rfl:    esomeprazole (NEXIUM) 20 MG capsule, Take 20 mg by mouth daily at 12 noon., Disp: , Rfl:    Ferrous Sulfate  (IRON) 325 (65 FE) MG TABS, Take 1 tablet by mouth daily.  , Disp: , Rfl:    fluticasone  (FLONASE ) 50 MCG/ACT nasal spray, Place 2 sprays into the nose daily.  , Disp: , Rfl:    ibuprofen (ADVIL) 200 MG tablet, Take 200 mg by mouth every 6 (six) hours as needed., Disp: , Rfl:    metFORMIN  (GLUCOPHAGE -XR) 500 MG 24 hr tablet, TAKE 2 TABLETS BY MOUTH TWICE DAILY WITH MEAL, Disp: , Rfl:    montelukast (SINGULAIR) 10 MG tablet, Take 10 mg by mouth at bedtime., Disp: , Rfl:    Multiple Vitamins-Minerals (MULTIVITAMINS THER. W/MINERALS) TABS, Take 1 tablet by mouth daily.  , Disp: , Rfl:    NURTEC 75 MG TBDP, DISSOLVE 1 TABLET ON THE TONGUE EVERY 24 HOUR AT ONSET OF HEADACHE AS NEEDED. DO NOT EXCEED 1 TABLET / 24 HOURS, Disp: , Rfl:    topiramate (TOPAMAX) 100 MG tablet, Take 100 mg by mouth daily., Disp: , Rfl:    vitamin B-12 (CYANOCOBALAMIN) 1000 MCG tablet, Take 1,000 mcg by mouth daily., Disp: , Rfl:   ALLERGIES: Allergies  Allergen Reactions    Adhesive [Tape] Itching    Band aids cause redness , Swelling and itching   Morphine And Codeine Other (See Comments)    Pt states Morphine does not relieve pain   Oxycodone Itching   Penicillins Hives    FAMILY HISTORY: Family History  Problem Relation Age of Onset   Breast cancer Maternal Aunt  Ovarian cancer Neg Hx    Colon cancer Neg Hx    Endometrial cancer Neg Hx     SOCIAL HISTORY: Social History   Socioeconomic History   Marital status: Divorced    Spouse name: Not on file   Number of children: Not on file   Years of education: Not on file   Highest education level: Not on file  Occupational History   Not on file  Tobacco Use   Smoking status: Never   Smokeless tobacco: Never  Vaping Use   Vaping status: Never Used  Substance and Sexual Activity   Alcohol use: Not Currently   Drug use: Never   Sexual activity: Not Currently  Other Topics Concern   Not on file  Social History Narrative   Not on file   Social Drivers of Health   Financial Resource Strain: Not on file  Food Insecurity: No Food Insecurity (09/06/2024)   Hunger Vital Sign    Worried About Running Out of Food in the Last Year: Never true    Ran Out of Food in the Last Year: Never true  Transportation Needs: No Transportation Needs (09/06/2024)   PRAPARE - Administrator, Civil Service (Medical): No    Lack of Transportation (Non-Medical): No  Physical Activity: Not on file  Stress: Not on file  Social Connections: Not on file  Intimate Partner Violence: Not At Risk (09/06/2024)   Humiliation, Afraid, Rape, and Kick questionnaire    Fear of Current or Ex-Partner: No    Emotionally Abused: No    Physically Abused: No    Sexually Abused: No    REVIEW OF SYSTEMS: New patient intake form was reviewed.  Complete 10-system review is negative except for the following: None  PHYSICAL EXAM: BP 110/73 (BP Location: Left Arm, Patient Position: Sitting)   Pulse 76   Temp 98.5 F  (36.9 C) (Oral)   Resp 19   Ht 5' 7 (1.702 m)   Wt 230 lb 9.6 oz (104.6 kg)   SpO2 99%   BMI 36.12 kg/m  Constitutional: No acute distress. Neuro/Psych: Alert, oriented.  Head and Neck: Normocephalic, atraumatic. Neck symmetric without masses. Sclera anicteric.  Respiratory: Normal work of breathing. Clear to auscultation bilaterally. Cardiovascular: Regular rate and rhythm, no murmurs, rubs, or gallops. Abdomen: Normoactive bowel sounds. Soft, non-distended, non-tender to palpation. No masses appreciated.  Well-healed Pfannenstiel incision Extremities: Grossly normal range of motion. Warm, well perfused. No edema bilaterally.  Bilateral knee scars well-healed Skin: No rashes or lesions. Lymphatic: No cervical, supraclavicular, or inguinal adenopathy. Genitourinary: External genitalia without lesions. Urethral meatus without lesions or prolapse. On speculum exam, vagina and cervix without lesions. Bimanual exam reveals normal cervix, retroverted mobile uterus, no adnexal mass. Exam chaperoned by Eleanor Epps, NP   LABORATORY AND RADIOLOGIC DATA: Outside medical records were reviewed to synthesize the above history, along with the history and physical obtained during the visit.  Outside laboratory, pathology, and imaging reports were reviewed, with pertinent results below.  I personally reviewed the outside images.  WBC  Date Value Ref Range Status  05/26/2021 9.4 4.0 - 10.5 K/uL Final   Hemoglobin  Date Value Ref Range Status  05/26/2021 13.5 12.0 - 15.0 g/dL Final   HCT  Date Value Ref Range Status  05/26/2021 40.7 36.0 - 46.0 % Final   Platelets  Date Value Ref Range Status  05/26/2021 330 150 - 400 K/uL Final   Creatinine, Ser  Date Value Ref Range Status  05/26/2021 0.74 0.44 - 1.00 mg/dL Final   AST  Date Value Ref Range Status  05/26/2021 49 (H) 15 - 41 U/L Final   ALT  Date Value Ref Range Status  05/26/2021 43 0 - 44 U/L Final    Surgical pathology  (08/16/24): Endometrial biopsy: Well-differentiated endometrioid carcinoma squamous metaplasia (FIGO I).  MMR proteins intact.  US  Transvaginal Non-OB 07/07/2017  Narrative CLINICAL DATA:  Initial evaluation for amenorrhea.  EXAM: TRANSABDOMINAL AND TRANSVAGINAL ULTRASOUND OF PELVIS  TECHNIQUE: Both transabdominal and transvaginal ultrasound examinations of the pelvis were performed. Transabdominal technique was performed for global imaging of the pelvis including uterus, ovaries, adnexal regions, and pelvic cul-de-sac. It was necessary to proceed with endovaginal exam following the transabdominal exam to visualize the uterus and ovaries.  COMPARISON:  None  FINDINGS: Uterus  Measurements: 6.7 x 4.7 x 6.5 cm. Uterus is retroverted. No mass lesion identified.  Endometrium  Thickness: 7.2 mm.  No focal abnormality visualized.  Right ovary  Measurements: 3.1 x 2.2 x 3.0 cm. Normal appearance/no adnexal mass.  Left ovary  Measurements: 3.6 x 3.0 x 3.0 cm. Normal appearance/no adnexal mass.  Other findings  No abnormal free fluid.  IMPRESSION: Normal pelvic ultrasound. Normal sonographic appearance of the uterus and ovaries.   Electronically Signed By: Morene Hoard M.D. On: 07/07/2017 19:34

## 2024-09-11 NOTE — Addendum Note (Signed)
 Addended by: MICHELINE SETTER D on: 09/11/2024 12:14 PM   Modules accepted: Orders

## 2024-09-19 ENCOUNTER — Encounter: Payer: Self-pay | Admitting: Family Medicine

## 2024-09-19 ENCOUNTER — Telehealth: Payer: Self-pay | Admitting: Licensed Clinical Social Worker

## 2024-09-19 NOTE — Telephone Encounter (Signed)
 [facility] Clinical Social Work  Clinical Social Work was referred by new patient protocol for assessment of psychosocial needs.  Clinical Social Worker attempted to contact patient by phone to offer support and assess for needs.   No answer. Left VM with direct contact information and brief description of support services.     [name] E Carlis Burnsworth, LCSW  Clinical Social Worker [facility]

## 2024-10-08 NOTE — Patient Instructions (Signed)
 SURGICAL WAITING ROOM VISITATION Patients having surgery or a procedure may have no more than 2 support people in the waiting area - these visitors may rotate in the visitor waiting room.   If the patient needs to stay at the hospital during part of their recovery, the visitor guidelines for inpatient rooms apply.  PRE-OP VISITATION  Pre-op nurse will coordinate an appropriate time for 1 support person to accompany the patient in pre-op.  This support person may not rotate.  This visitor will be contacted when the time is appropriate for the visitor to come back in the pre-op area.  Please refer to the [facility] website for the visitor guidelines for Inpatients (after your surgery is over and you are in a regular room).  You are not required to quarantine at this time prior to your surgery. However, you must do this: Hand Hygiene often Do NOT share personal items Notify your provider if you are in close contact with someone who has COVID or you develop fever 100.4 or greater, new onset of sneezing, cough, sore throat, shortness of breath or body aches.  If you test positive for Covid or have been in contact with anyone that has tested positive in the last 10 days please notify you surgeon.    Your procedure is scheduled on:  TUESDAY  October 17, 2024  Report to [facility] Main Entrance: Rana entrance where the [employer] is available.   Report to admitting at: 05:15    AM  Call this number if you have any questions or problems the morning of surgery [phone]  FOLLOW ANY ADDITIONAL PRE OP INSTRUCTIONS YOU RECEIVED FROM YOUR SURGEON'S OFFICE!!!  Eat a light diet the day before surgery.  Examples including soups, broths, toast, yogurt, mashed potatoes.  AVOID GAS PRODUCING FOODS. Things to avoid include carbonated beverages (fizzy beverages, sodas), raw fruits and raw vegetables (uncooked), or beans.   Do not eat food after Midnight the night prior to your  surgery/procedure.  After Midnight you may have the following liquids until   05:15 AM  DAY OF SURGERY  Clear Liquid Diet Water Black Coffee (sugar ok, NO MILK/CREAM OR CREAMERS)  Tea (sugar ok, NO MILK/CREAM OR CREAMERS) regular and decaf                             Plain Jell-O  with no fruit (NO RED)                                           Fruit ices (not with fruit pulp, NO RED)                                     Popsicles (NO RED)                                                                  Juice: NO CITRUS JUICES: only apple, WHITE grape, WHITE cranberry Sports drinks like Gatorade or Powerade (NO RED)  Oral Hygiene is also important to reduce your risk of infection.        Remember - BRUSH YOUR TEETH THE MORNING OF SURGERY WITH YOUR REGULAR TOOTHPASTE  Do NOT smoke after Midnight the night before surgery.  METFORMIN - Day BEFORE surgery, take as usual.                  DO NOT TAKE METFORMIN  THE DAY OF YOUR SURGERY.   STOP TAKING all Vitamins, Herbs and supplements 1 week before your surgery.   Take ONLY these medicines the morning of surgery with A SIP OF WATER: esomeprazole, duloxetine , bupropion, Atogepant (Qulipta), and Provera .  You may use your Flonase  nasal spray if needed.    If You have been diagnosed with Sleep Apnea - Bring CPAP mask and tubing day of surgery. We will provide you with a CPAP machine on the day of your surgery.                   You may not have any metal on your body including hair pins, jewelry, and body piercing  Do not wear make-up, lotions, powders, perfumes , or deodorant  Do not wear nail polish including gel and S&S, artificial / acrylic nails, or any other type of covering on natural nails including finger and toenails. If you have artificial nails, gel coating, etc., that needs to be removed by a nail salon, Please have this removed prior to surgery. Not doing so may mean that your surgery could be cancelled or delayed if  the Surgeon or anesthesia staff feels like they are unable to monitor you safely.   Do not shave 48 hours prior to surgery to avoid nicks in your skin which may contribute to postoperative infections.   Contacts, Hearing Aids, dentures or bridgework may not be worn into surgery. DENTURES WILL BE REMOVED PRIOR TO SURGERY PLEASE DO NOT APPLY Poly grip OR ADHESIVES!!!  You may bring a small overnight bag with you on the day of surgery, only pack items that are not valuable.  IS NOT RESPONSIBLE   FOR VALUABLES THAT ARE LOST OR STOLEN.   Patients discharged on the day of surgery will not be allowed to drive home.  Someone NEEDS to stay with you for the first 24 hours after anesthesia.  Do not bring your home medications to the hospital. The Pharmacy will dispense medications listed on your medication list to you during your admission in the Hospital.  Special Instructions: Bring a copy of your healthcare power of attorney and living will documents the day of surgery, if you wish to have them scanned into your [place] [facility] Records- EPIC  Please read over the following fact sheets you were given: IF YOU HAVE QUESTIONS ABOUT YOUR PRE-OP INSTRUCTIONS, PLEASE CALL [phone]   [facility] - Preparing for Surgery        Before surgery, you can play an important role.  Because skin is not sterile, your skin needs to be as free of germs as possible.  You can reduce the number of germs on your skin by washing with CHG (chlorahexidine gluconate) soap before surgery.  CHG is an antiseptic cleaner which kills germs and bonds with the skin to continue killing germs even after washing. Please DO NOT use if you have an allergy to CHG or antibacterial soaps.  If your skin becomes reddened/irritated stop using the CHG and inform your nurse when you arrive at Short Stay. Do not shave (including legs  and underarms) for at least 48 hours prior to the first CHG shower.  You may shave your  face/neck.  Please follow these instructions carefully:  1.  Shower with CHG Soap the night before surgery ONLY (DO NOT USE THE CHG SOAP THE MORNING OF SURGERY).  2.  If you choose to wash your hair, wash your hair first as usual with your normal  shampoo.  3.  After you shampoo, rinse your hair and body thoroughly to remove the shampoo.                             4.  Use CHG as you would any other liquid soap.  You can apply chg directly to the skin and wash.  Gently with a scrungie or clean washcloth.  5.  Apply the CHG Soap to your body ONLY FROM THE NECK DOWN.   Do not use on face/ open                           Wound or open sores. Avoid contact with eyes, ears mouth and genitals (private parts).                       Wash face,  Genitals (private parts) with your normal soap.             6.  Wash thoroughly, paying special attention to the area where your  surgery  will be performed.  7.  Thoroughly rinse your body with warm water from the neck down.  8.  DO NOT shower/wash with your normal soap after using and rinsing off the CHG Soap.                9.  Pat yourself dry with a clean towel.            10.  Wear clean pajamas.            11.  Place clean sheets on your bed the night of your first shower and do not  sleep with pets.  Day of Surgery : Do not apply any CHG, lotions/deodorants the morning of surgery.  Please wear clean clothes to the hospital/surgery center.   FAILURE TO FOLLOW THESE INSTRUCTIONS MAY RESULT IN THE CANCELLATION OF YOUR SURGERY  PATIENT SIGNATURE_________________________________  NURSE SIGNATURE__________________________________  ________________________________________________________________________

## 2024-10-08 NOTE — Progress Notes (Signed)
 COVID Vaccine received:  []  No [x]  Yes Date of any COVID positive Test in last 90 days:  PCP - Olam Pinal, MD at [facility] [phone]  Cardiologist - none  Chest x-ray -  EKG - 2022  repeated  Stress Test -  ECHO -  Cardiac Cath -  CT Coronary Calcium score:   Bowel Prep - []  No  []   Yes ______  Pacemaker / ICD device [x]  No []  Yes   Spinal Cord Stimulator:[x]  No []  Yes       History of Sleep Apnea? []  No [x]  Yes   CPAP used?- []  No [x]  Yes    Patient has: [x]  NO Hx DM   []  Pre-DM   []  DM1  []   DM2 Does the patient monitor blood sugar?   [x]  N/A   []  No []  Yes   Takes Metformin  for PCOS - hold DOS  Blood Thinner / Instructions:  none Aspirin Instructions:  none  Dental hx: []  Dentures:  []  N/A      []  Bridge or Partial:                   []  Loose or Damaged teeth:   Comments:   Activity level: Able to walk up 2 flights of stairs without becoming significantly short of breath or having chest pain?  []  No   []    Yes  Patient can perform ADLs without assistance. []  No   []   Yes  Anesthesia review: PCOS- on Metformin , OSA-CPAP, GERD, PONV  Patient denies any S&S of respiratory illness or Covid - no shortness of breath, fever, cough or chest pain at PAT appointment.  Patient verbalized understanding and agreement to the Pre-Surgical Instructions that were given to them at this PAT appointment. Patient was also educated of the need to review these PAT instructions again prior to her surgery.I reviewed the appropriate phone numbers to call if they have any and questions or concerns.

## 2024-10-09 ENCOUNTER — Encounter (HOSPITAL_COMMUNITY): Payer: Self-pay

## 2024-10-09 ENCOUNTER — Other Ambulatory Visit: Payer: Self-pay

## 2024-10-09 ENCOUNTER — Telehealth: Payer: Self-pay | Admitting: *Deleted

## 2024-10-09 ENCOUNTER — Encounter (HOSPITAL_COMMUNITY)
Admission: RE | Admit: 2024-10-09 | Discharge: 2024-10-09 | Disposition: A | Source: Ambulatory Visit | Attending: Psychiatry | Admitting: Psychiatry

## 2024-10-09 VITALS — BP 119/75 | HR 82 | Temp 98.1°F | Resp 20 | Ht 67.0 in | Wt 224.0 lb

## 2024-10-09 DIAGNOSIS — Z01818 Encounter for other preprocedural examination: Secondary | ICD-10-CM | POA: Diagnosis present

## 2024-10-09 DIAGNOSIS — R9431 Abnormal electrocardiogram [ECG] [EKG]: Secondary | ICD-10-CM | POA: Diagnosis not present

## 2024-10-09 DIAGNOSIS — C541 Malignant neoplasm of endometrium: Secondary | ICD-10-CM | POA: Diagnosis not present

## 2024-10-09 HISTORY — DX: Personal history of urinary calculi: Z87.442

## 2024-10-09 HISTORY — DX: Anxiety disorder, unspecified: F41.9

## 2024-10-09 HISTORY — DX: Malignant (primary) neoplasm, unspecified: C80.1

## 2024-10-09 LAB — COMPREHENSIVE METABOLIC PANEL WITH GFR
ALT: 31 U/L (ref 0–44)
AST: 27 U/L (ref 15–41)
Albumin: 4.2 g/dL (ref 3.5–5.0)
Alkaline Phosphatase: 63 U/L (ref 38–126)
Anion gap: 10 (ref 5–15)
BUN: 13 mg/dL (ref 6–20)
CO2: 25 mmol/L (ref 22–32)
Calcium: 9.6 mg/dL (ref 8.9–10.3)
Chloride: 104 mmol/L (ref 98–111)
Creatinine, Ser: 0.96 mg/dL (ref 0.44–1.00)
GFR, Estimated: >60 mL/min (ref >60)
Glucose, Bld: 86 mg/dL (ref 70–99)
Potassium: 4 mmol/L (ref 3.5–5.1)
Sodium: 138 mmol/L (ref 135–145)
Total Bilirubin: 0.5 mg/dL (ref 0.0–1.2)
Total Protein: 7.2 g/dL (ref 6.5–8.1)

## 2024-10-09 LAB — CBC
HCT: 42.1 % (ref 36.0–46.0)
Hemoglobin: 13.6 g/dL (ref 12.0–15.0)
MCH: 30.8 pg (ref 26.0–34.0)
MCHC: 32.3 g/dL (ref 30.0–36.0)
MCV: 95.2 fL (ref 80.0–100.0)
Platelets: 316 K/uL (ref 150–400)
RBC: 4.42 MIL/uL (ref 3.87–5.11)
RDW: 15.7 % — ABNORMAL HIGH (ref 11.5–15.5)
WBC: 7.3 K/uL (ref 4.0–10.5)
nRBC: 0 % (ref 0.0–0.2)

## 2024-10-09 NOTE — Telephone Encounter (Signed)
 Spoke with Adriana Newman who called and left a message for the office.  Pt states she started having vaginal spotting last week and over the weekend the bleeding has increased. She states the blood is dark red with small clots and she is changing her pads every few hours but they are not saturated. She denies fever and or chills but reports constant cramping. Started on the provera  10 mg on 9/22 and states, I think it's doing the opposite and causing more bleeding  & overall I don't feel well.   Advised patient to try using  heating pad for the cramps and ibuprofen and her message will be relayed to providers and the office will call back with any new recommendations.   Pt has her pre-op apt. Today at 1 pm for her scheduled surgery with Dr. Eldonna on 10/17/24.

## 2024-10-09 NOTE — Telephone Encounter (Signed)
 Patient called and left a voicemail stating I have surgery with Dr Eldonna on 10/28. This pass weekend I started having bleeding, cramping and some discharge. I would like to talk with someone.

## 2024-10-09 NOTE — Telephone Encounter (Signed)
 Spoke with Ms. Burich and relayed normal  results of her pre-op labs. Advised patient per Eleanor Epps, NP to continue monitoring the bleeding, if it becomes heavy with or without pain or fever to call the office back and continue to take the provera  and plan surgery with Dr. Eldonna on 1028/25. Pt verbalized understanding and thanked the office for calling.

## 2024-10-10 NOTE — Telephone Encounter (Signed)
 Attempted to reach patient to relay message from Dr. Eldonna. Left voicemail requesting call back to [phone].

## 2024-10-10 NOTE — Telephone Encounter (Signed)
 Spoke with patient and relayed message from Dr. Eldonna that patient can increase her provera  to 2 tablets ( 20 mg) total daily until surgery to help with the bleeding. Pt verbalized understanding and thanked the office for calling.

## 2024-10-16 ENCOUNTER — Telehealth: Payer: Self-pay | Admitting: *Deleted

## 2024-10-16 NOTE — Anesthesia Preprocedure Evaluation (Signed)
 Anesthesia Evaluation  Patient identified by MRN, date of birth, ID band Patient awake    Reviewed: Allergy & Precautions, NPO status , Patient's Chart, lab work & pertinent test results  History of Anesthesia Complications (+) PONV and history of anesthetic complications  Airway Mallampati: II  TM Distance: >3 FB Neck ROM: Full    Dental no notable dental hx.    Pulmonary asthma , sleep apnea and Continuous Positive Airway Pressure Ventilation    Pulmonary exam normal        Cardiovascular negative cardio ROS Normal cardiovascular exam     Neuro/Psych  Headaches  Anxiety Depression       GI/Hepatic Neg liver ROS,GERD  Medicated,,  Endo/Other  diabetes, Type 2, Oral Hypoglycemic Agents    Renal/GU negative Renal ROS     Musculoskeletal negative musculoskeletal ROS (+)    Abdominal   Peds  Hematology negative hematology ROS (+)   Anesthesia Other Findings   Reproductive/Obstetrics ENDOMETRIAL CANCER                              Anesthesia Physical Anesthesia Plan  ASA: 2  Anesthesia Plan: General   Post-op Pain Management: Tylenol  PO (pre-op)*   Induction: Intravenous  PONV Risk Score and Plan: 4 or greater and Treatment may vary due to age or medical condition, Midazolam , Scopolamine  patch - Pre-op, TIVA, Propofol  infusion, Dexamethasone  and Ondansetron   Airway Management Planned: Oral ETT  Additional Equipment: None  Intra-op Plan:   Post-operative Plan: Extubation in OR  Informed Consent: I have reviewed the patients History and Physical, chart, labs and discussed the procedure including the risks, benefits and alternatives for the proposed anesthesia with the patient or authorized representative who has indicated his/her understanding and acceptance.     Dental advisory given  Plan Discussed with: CRNA  Anesthesia Plan Comments:          Anesthesia Quick  Evaluation

## 2024-10-16 NOTE — Telephone Encounter (Signed)
 Telephone call to check on pre-operative status.  Patient compliant with pre-operative instructions.  Reinforced nothing to eat after midnight. Clear liquids until 0415. Patient to arrive at 0515.  No questions or concerns voiced.  Instructed to call for any needs.

## 2024-10-17 ENCOUNTER — Encounter (HOSPITAL_COMMUNITY)
Admission: RE | Disposition: A | Discharge status: Home / Self Care | Payer: Self-pay | Source: Ambulatory Visit | Attending: Psychiatry

## 2024-10-17 ENCOUNTER — Ambulatory Visit (HOSPITAL_COMMUNITY): Payer: Self-pay | Admitting: Anesthesiology

## 2024-10-17 ENCOUNTER — Other Ambulatory Visit: Payer: Self-pay

## 2024-10-17 ENCOUNTER — Ambulatory Visit (HOSPITAL_BASED_OUTPATIENT_CLINIC_OR_DEPARTMENT_OTHER): Payer: Self-pay | Admitting: Anesthesiology

## 2024-10-17 ENCOUNTER — Ambulatory Visit (HOSPITAL_COMMUNITY)
Admission: RE | Admit: 2024-10-17 | Discharge: 2024-10-17 | Disposition: A | Discharge status: Home / Self Care | Source: Ambulatory Visit | Attending: Psychiatry | Admitting: Psychiatry

## 2024-10-17 ENCOUNTER — Encounter (HOSPITAL_COMMUNITY): Payer: Self-pay | Admitting: Psychiatry

## 2024-10-17 DIAGNOSIS — K5790 Diverticulosis of intestine, part unspecified, without perforation or abscess without bleeding: Secondary | ICD-10-CM | POA: Diagnosis not present

## 2024-10-17 DIAGNOSIS — C541 Malignant neoplasm of endometrium: Secondary | ICD-10-CM

## 2024-10-17 DIAGNOSIS — K660 Peritoneal adhesions (postprocedural) (postinfection): Secondary | ICD-10-CM | POA: Diagnosis not present

## 2024-10-17 DIAGNOSIS — G4733 Obstructive sleep apnea (adult) (pediatric): Secondary | ICD-10-CM | POA: Diagnosis not present

## 2024-10-17 DIAGNOSIS — R7303 Prediabetes: Secondary | ICD-10-CM | POA: Insufficient documentation

## 2024-10-17 DIAGNOSIS — K219 Gastro-esophageal reflux disease without esophagitis: Secondary | ICD-10-CM | POA: Diagnosis not present

## 2024-10-17 DIAGNOSIS — J45998 Other asthma: Secondary | ICD-10-CM | POA: Insufficient documentation

## 2024-10-17 HISTORY — PX: LYMPH NODE BIOPSY: SHX201

## 2024-10-17 HISTORY — PX: INJECTION, FOR SENTINEL LYMPH NODE IDENTIFICATION: SHX7598

## 2024-10-17 HISTORY — PX: ROBOTIC ASSISTED TOTAL HYSTERECTOMY WITH BILATERAL SALPINGO OOPHERECTOMY: SHX6086

## 2024-10-17 LAB — POCT PREGNANCY, URINE: Preg Test, Ur: NEGATIVE

## 2024-10-17 LAB — TYPE AND SCREEN
ABO/RH(D): O POS
Antibody Screen: NEGATIVE

## 2024-10-17 SURGERY — HYSTERECTOMY, TOTAL, ROBOT-ASSISTED, LAPAROSCOPIC, WITH BILATERAL SALPINGO-OOPHORECTOMY
Anesthesia: General | Laterality: Bilateral

## 2024-10-17 MED ORDER — LIDOCAINE 2% (20 MG/ML) 5 ML SYRINGE
INTRAMUSCULAR | Status: DC | PRN
  Administered 2024-10-17: 100 mg via INTRAVENOUS

## 2024-10-17 MED ORDER — ROCURONIUM BROMIDE 100 MG/10ML IV SOLN
INTRAVENOUS | Status: DC | PRN
  Administered 2024-10-17: 60 mg via INTRAVENOUS
  Administered 2024-10-17: 15 mg via INTRAVENOUS
  Administered 2024-10-17: 20 mg via INTRAVENOUS
  Administered 2024-10-17: 10 mg via INTRAVENOUS
  Administered 2024-10-17: 20 mg via INTRAVENOUS

## 2024-10-17 MED ORDER — STERILE WATER FOR INJECTION IJ SOLN
INTRAMUSCULAR | Status: DC | PRN
  Administered 2024-10-17: 1 mL

## 2024-10-17 MED ORDER — PROPOFOL 500 MG/50ML IV EMUL: INTRAVENOUS | Status: DC | PRN

## 2024-10-17 MED ORDER — ONDANSETRON HCL 4 MG/2ML IJ SOLN
INTRAMUSCULAR | Status: AC
  Filled 2024-10-17: qty 2

## 2024-10-17 MED ORDER — ROCURONIUM BROMIDE 10 MG/ML (PF) SYRINGE
PREFILLED_SYRINGE | INTRAVENOUS | Status: AC
Start: 2024-10-17 — End: 2024-10-17
  Filled 2024-10-17: qty 10

## 2024-10-17 MED ORDER — PROPOFOL 500 MG/50ML IV EMUL
INTRAVENOUS | Status: DC | PRN
  Administered 2024-10-17 (×2): 150 ug/kg/min via INTRAVENOUS

## 2024-10-17 MED ORDER — STERILE WATER FOR IRRIGATION IR SOLN
Status: DC | PRN
  Administered 2024-10-17: 1000 mL

## 2024-10-17 MED ORDER — BUPIVACAINE HCL 0.25 % IJ SOLN
INTRAMUSCULAR | Status: DC | PRN
  Administered 2024-10-17: 25 mL

## 2024-10-17 MED ORDER — HYDROMORPHONE HCL 1 MG/ML IJ SOLN
INTRAMUSCULAR | Status: AC
  Filled 2024-10-17: qty 1

## 2024-10-17 MED ORDER — LACTATED RINGERS IR SOLN
Status: DC | PRN
  Administered 2024-10-17: 1

## 2024-10-17 MED ORDER — ACETAMINOPHEN 500 MG PO TABS
1000.0000 mg | ORAL_TABLET | Freq: Once | ORAL | Status: AC
  Administered 2024-10-17: 1000 mg via ORAL
  Filled 2024-10-17: qty 2

## 2024-10-17 MED ORDER — FENTANYL CITRATE (PF) 250 MCG/5ML IJ SOLN
INTRAMUSCULAR | Status: AC
  Filled 2024-10-17: qty 5

## 2024-10-17 MED ORDER — LIDOCAINE HCL (PF) 2 % IJ SOLN
INTRAMUSCULAR | Status: AC
Start: 2024-10-17 — End: 2024-10-17
  Filled 2024-10-17: qty 20

## 2024-10-17 MED ORDER — HYDROMORPHONE HCL 1 MG/ML IJ SOLN
0.2500 mg | INTRAMUSCULAR | Status: DC | PRN
  Administered 2024-10-17 (×2): 0.5 mg via INTRAVENOUS

## 2024-10-17 MED ORDER — STERILE WATER FOR INJECTION IJ SOLN
INTRAMUSCULAR | Status: DC | PRN
  Administered 2024-10-17: 5 mL

## 2024-10-17 MED ORDER — SUGAMMADEX SODIUM 200 MG/2ML IV SOLN
INTRAVENOUS | Status: AC
  Filled 2024-10-17: qty 4

## 2024-10-17 MED ORDER — STERILE WATER FOR INJECTION IJ SOLN
INTRAMUSCULAR | Status: AC
  Filled 2024-10-17: qty 10

## 2024-10-17 MED ORDER — BUPIVACAINE HCL (PF) 0.25 % IJ SOLN
INTRAMUSCULAR | Status: AC
  Filled 2024-10-17: qty 30

## 2024-10-17 MED ORDER — CHLORHEXIDINE GLUCONATE 0.12 % MT SOLN
15.0000 mL | Freq: Once | OROMUCOSAL | Status: AC
  Administered 2024-10-17: 15 mL via OROMUCOSAL

## 2024-10-17 MED ORDER — FENTANYL CITRATE (PF) 250 MCG/5ML IJ SOLN
INTRAMUSCULAR | Status: DC | PRN
  Administered 2024-10-17 (×2): 25 ug via INTRAVENOUS
  Administered 2024-10-17 (×2): 50 ug via INTRAVENOUS
  Administered 2024-10-17 (×2): 25 ug via INTRAVENOUS
  Administered 2024-10-17: 100 ug via INTRAVENOUS

## 2024-10-17 MED ORDER — LIDOCAINE HCL (PF) 2 % IJ SOLN
INTRAMUSCULAR | Status: DC | PRN
  Administered 2024-10-17: 1.5 mg/kg/h via INTRADERMAL

## 2024-10-17 MED ORDER — SUGAMMADEX SODIUM 200 MG/2ML IV SOLN
INTRAVENOUS | Status: DC | PRN
  Administered 2024-10-17: 400 mg via INTRAVENOUS

## 2024-10-17 MED ORDER — FENTANYL CITRATE (PF) 100 MCG/2ML IJ SOLN
INTRAMUSCULAR | Status: AC
  Filled 2024-10-17: qty 2

## 2024-10-17 MED ORDER — SODIUM CHLORIDE 0.9 % IV SOLN: INTRAVENOUS | Status: DC | PRN

## 2024-10-17 MED ORDER — ORAL CARE MOUTH RINSE: 15.0000 mL | Freq: Once | OROMUCOSAL | Status: AC

## 2024-10-17 MED ORDER — ROCURONIUM BROMIDE 10 MG/ML (PF) SYRINGE
PREFILLED_SYRINGE | INTRAVENOUS | Status: AC
  Filled 2024-10-17: qty 10

## 2024-10-17 MED ORDER — SCOPOLAMINE 1 MG/3DAYS TD PT72
1.0000 | MEDICATED_PATCH | TRANSDERMAL | Status: DC
  Administered 2024-10-17: 1 mg via TRANSDERMAL

## 2024-10-17 MED ORDER — METRONIDAZOLE 500 MG/100ML IV SOLN
500.0000 mg | INTRAVENOUS | Status: AC
  Administered 2024-10-17: 500 mg via INTRAVENOUS
  Filled 2024-10-17: qty 100

## 2024-10-17 MED ORDER — PROPOFOL 10 MG/ML IV BOLUS
INTRAVENOUS | Status: DC | PRN
  Administered 2024-10-17: 200 mg via INTRAVENOUS

## 2024-10-17 MED ORDER — DROPERIDOL 2.5 MG/ML IJ SOLN: 0.6250 mg | Freq: Once | INTRAMUSCULAR | Status: DC | PRN

## 2024-10-17 MED ORDER — MIDAZOLAM HCL (PF) 2 MG/2ML IJ SOLN
INTRAMUSCULAR | Status: DC | PRN
  Administered 2024-10-17: 2 mg via INTRAVENOUS

## 2024-10-17 MED ORDER — PROPOFOL 10 MG/ML IV BOLUS
INTRAVENOUS | Status: AC
  Filled 2024-10-17: qty 20

## 2024-10-17 MED ORDER — SCOPOLAMINE 1 MG/3DAYS TD PT72
1.0000 | MEDICATED_PATCH | TRANSDERMAL | Status: DC
  Filled 2024-10-17: qty 1

## 2024-10-17 MED ORDER — CEFAZOLIN SODIUM-DEXTROSE 2-4 GM/100ML-% IV SOLN
2.0000 g | INTRAVENOUS | Status: AC
  Administered 2024-10-17: 2 g via INTRAVENOUS
  Filled 2024-10-17: qty 100

## 2024-10-17 MED ORDER — KETAMINE HCL 50 MG/5ML IJ SOSY
PREFILLED_SYRINGE | INTRAMUSCULAR | Status: AC
  Filled 2024-10-17: qty 5

## 2024-10-17 MED ORDER — HEPARIN SODIUM (PORCINE) 5000 UNIT/ML IJ SOLN
5000.0000 [IU] | INTRAMUSCULAR | Status: AC
  Administered 2024-10-17: 5000 [IU] via SUBCUTANEOUS
  Filled 2024-10-17: qty 1

## 2024-10-17 MED ORDER — ONDANSETRON HCL 4 MG/2ML IJ SOLN
INTRAMUSCULAR | Status: DC | PRN
  Administered 2024-10-17: 4 mg via INTRAVENOUS

## 2024-10-17 MED ORDER — MIDAZOLAM HCL 2 MG/2ML IJ SOLN
INTRAMUSCULAR | Status: AC
  Filled 2024-10-17: qty 2

## 2024-10-17 MED ORDER — LACTATED RINGERS IV SOLN: INTRAVENOUS | Status: DC

## 2024-10-17 MED ORDER — DEXAMETHASONE SOD PHOSPHATE PF 10 MG/ML IJ SOLN
4.0000 mg | INTRAMUSCULAR | Status: AC
  Administered 2024-10-17: 4 mg via INTRAVENOUS

## 2024-10-17 MED ORDER — ACETAMINOPHEN 500 MG PO TABS: 1000.0000 mg | ORAL_TABLET | ORAL | Status: AC

## 2024-10-17 SURGICAL SUPPLY — 70 items
APPLICATOR SURGIFLO ENDO (HEMOSTASIS) IMPLANT
BAG LAPAROSCOPIC 12 15 PORT 16 (BASKET) IMPLANT
BLADE SURG SZ10 CARB STEEL (BLADE) IMPLANT
COVER BACK TABLE 60X90IN (DRAPES) ×3 IMPLANT
COVER TIP SHEARS 8 DVNC (MISCELLANEOUS) ×3 IMPLANT
DERMABOND ADVANCED .7 DNX12 (GAUZE/BANDAGES/DRESSINGS) ×3 IMPLANT
DERMABOND ADVANCED .7 DNX6 (GAUZE/BANDAGES/DRESSINGS) ×1 IMPLANT
DRAPE ARM DVNC X/XI (DISPOSABLE) ×12 IMPLANT
DRAPE COLUMN DVNC XI (DISPOSABLE) ×3 IMPLANT
DRAPE SHEET LG 3/4 BI-LAMINATE (DRAPES) ×3 IMPLANT
DRAPE SURG IRRIG POUCH 19X23 (DRAPES) ×3 IMPLANT
DRIVER NDL MEGA SUTCUT DVNCXI (INSTRUMENTS) ×2 IMPLANT
DRIVER NDLE MEGA SUTCUT DVNCXI (INSTRUMENTS) ×2 IMPLANT
DRSG OPSITE POSTOP 4X6 (GAUZE/BANDAGES/DRESSINGS) IMPLANT
DRSG OPSITE POSTOP 4X8 (GAUZE/BANDAGES/DRESSINGS) IMPLANT
ELECT PENCIL ROCKER SW 15FT (MISCELLANEOUS) IMPLANT
ELECT REM PT RETURN 15FT ADLT (MISCELLANEOUS) ×3 IMPLANT
FORCEPS BPLR FENES DVNC XI (FORCEP) ×3 IMPLANT
FORCEPS PROGRASP DVNC XI (FORCEP) ×3 IMPLANT
GAUZE 4X4 16PLY ~~LOC~~+RFID DBL (SPONGE) ×3 IMPLANT
GLOVE BIO SURGEON STRL SZ 6.5 (GLOVE) ×3 IMPLANT
GLOVE BIOGEL PI IND STRL 6.5 (GLOVE) ×6 IMPLANT
GLOVE BIOGEL PI MICRO STRL 6 (GLOVE) ×12 IMPLANT
GOWN STRL REUS W/ TWL LRG LVL3 (GOWN DISPOSABLE) ×12 IMPLANT
GRASPER SUT TROCAR 14GX15 (MISCELLANEOUS) ×1 IMPLANT
HOLDER FOLEY CATH W/STRAP (MISCELLANEOUS) IMPLANT
IRRIGATION SUCT STRKRFLW 2 WTP (MISCELLANEOUS) ×3 IMPLANT
KIT PROCEDURE DVNC SI (MISCELLANEOUS) IMPLANT
KIT TURNOVER KIT A (KITS) ×3 IMPLANT
LIGASURE IMPACT 36 18CM CVD LR (INSTRUMENTS) IMPLANT
MANIPULATOR ADVINCU DEL 3.0 PL (MISCELLANEOUS) IMPLANT
MANIPULATOR ADVINCU DEL 3.5 PL (MISCELLANEOUS) IMPLANT
MANIPULATOR UTERINE 4.5 ZUMI (MISCELLANEOUS) IMPLANT
NDL HYPO 21X1.5 SAFETY (NEEDLE) ×2 IMPLANT
NDL INSUFFLATION 14GA 120MM (NEEDLE) IMPLANT
NDL SPNL 20GX3.5 QUINCKE YW (NEEDLE) IMPLANT
NEEDLE HYPO 21X1.5 SAFETY (NEEDLE) ×2 IMPLANT
NEEDLE INSUFFLATION 14GA 120MM (NEEDLE) IMPLANT
NEEDLE SPNL 20GX3.5 QUINCKE YW (NEEDLE) IMPLANT
OBTURATOR OPTICALSTD 8 DVNC (TROCAR) ×3 IMPLANT
PACK ROBOT GYN CUSTOM WL (TRAY / TRAY PROCEDURE) ×3 IMPLANT
PAD ARMBOARD POSITIONER FOAM (MISCELLANEOUS) ×3 IMPLANT
PAD POSITIONING PINK XL (MISCELLANEOUS) ×3 IMPLANT
PORT ACCESS TROCAR AIRSEAL 12 (TROCAR) ×1 IMPLANT
SCISSORS LAP 5X45 EPIX DISP (ENDOMECHANICALS) IMPLANT
SCISSORS MNPLR CVD DVNC XI (INSTRUMENTS) ×3 IMPLANT
SCRUB CHG 4% DYNA-HEX 4OZ (MISCELLANEOUS) ×6 IMPLANT
SEAL UNIV 5-12 XI (MISCELLANEOUS) ×12 IMPLANT
SET TRI-LUMEN FLTR TB AIRSEAL (TUBING) ×3 IMPLANT
SPIKE FLUID TRANSFER (MISCELLANEOUS) ×3 IMPLANT
SPONGE T-LAP 18X18 ~~LOC~~+RFID (SPONGE) IMPLANT
SURGIFLO W/THROMBIN 8M KIT (HEMOSTASIS) IMPLANT
SUT MNCRL AB 4-0 PS2 18 (SUTURE) IMPLANT
SUT PDS AB 1 TP1 54 (SUTURE) IMPLANT
SUT VIC AB 0 CT1 27XBRD ANTBC (SUTURE) IMPLANT
SUT VIC AB 2-0 CT1 TAPERPNT 27 (SUTURE) IMPLANT
SUT VIC AB 3-0 SH 27XBRD (SUTURE) ×2 IMPLANT
SUT VIC AB 4-0 PS2 18 (SUTURE) ×6 IMPLANT
SUT VICRYL 0 27 CT2 27 ABS (SUTURE) ×3 IMPLANT
SUT VICRYL 0 UR6 27IN ABS (SUTURE) ×1 IMPLANT
SYR 10ML LL (SYRINGE) IMPLANT
SYSTEM BAG RETRIEVAL 10MM (BASKET) IMPLANT
SYSTEM WOUND ALEXIS 18CM MED (MISCELLANEOUS) IMPLANT
TRAP SPECIMEN MUCUS 40CC (MISCELLANEOUS) IMPLANT
TRAY FOLEY MTR SLVR 16FR STAT (SET/KITS/TRAYS/PACK) ×3 IMPLANT
TROCAR PORT AIRSEAL 5X120 (TROCAR) IMPLANT
TROCAR XCEL NON-BLD 5MMX100MML (ENDOMECHANICALS) ×3 IMPLANT
UNDERPAD 30X36 HEAVY ABSORB (UNDERPADS AND DIAPERS) ×6 IMPLANT
WATER STERILE IRR 1000ML POUR (IV SOLUTION) ×3 IMPLANT
YANKAUER SUCT BULB TIP 10FT TU (MISCELLANEOUS) IMPLANT

## 2024-10-17 NOTE — Transfer of Care (Signed)
 Immediate Anesthesia Transfer of Care Note  Patient: Adriana Newman  Procedure(s) Performed: ROBOTIC ASSISTED TOTAL LAPAROSCOPIC HYSTERECTOMY WITH BILATERAL SALPINGO-OOPHORECTOMY (Bilateral) BILATERAL SENTINEL LYMPH NODE INJECTION (Bilateral) BILATERAL SENTINEL LYMPH NODE BIOPSY (Bilateral)  Patient Location: PACU  Anesthesia Type:General  Level of Consciousness: drowsy and patient cooperative  Airway & Oxygen Therapy: Patient Spontanous Breathing and Patient connected to face mask oxygen  Post-op Assessment: Report given to RN and Post -op Vital signs reviewed and stable  Post vital signs: Reviewed and stable  Last Vitals:  Vitals Value Taken Time  BP 152/88 10/17/24 10:37  Temp 36.5 C 10/17/24 10:37  Pulse 83 10/17/24 10:38  Resp 15 10/17/24 10:38  SpO2 99 % 10/17/24 10:38  Vitals shown include unfiled device data.  Last Pain:  Vitals:   10/17/24 0611  TempSrc:   PainSc: 0-No pain      Patients Stated Pain Goal: 4 (10/17/24 0606)  Complications: No notable events documented.

## 2024-10-17 NOTE — Discharge Instructions (Addendum)
 AFTER SURGERY INSTRUCTIONS   Return to work: 4-6 weeks if applicable   Activity: 1. Be up and out of the bed during the day.  Take a nap if needed.  You may walk up steps but be careful and use the hand rail.  Stair climbing will tire you more than you think, you may need to stop part way and rest.    2. No lifting or straining for 6 weeks over 10 pounds. No pushing, pulling, straining for 6 weeks.   3. No driving for 2-95 days when the following criteria have been met: Do not drive if you are taking narcotic pain medicine and make sure that your reaction time has returned.    4. You can shower as soon as the next day after surgery. Shower daily.  Use your regular soap and water (not directly on the incision) and pat your incision(s) dry afterwards; don't rub.  No tub baths or submerging your body in water until cleared by your surgeon. If you have the soap that was given to you by pre-surgical testing that was used before surgery, you do not need to use it afterwards because this can irritate your incisions.    5. No sexual activity and nothing in the vagina for 12 weeks.   6. You may experience a small amount of clear drainage from your incisions, which is normal.  If the drainage persists, increases, or changes color please call the office.   7. Do not use creams, lotions, or ointments such as neosporin on your incisions after surgery until advised by your surgeon because they can cause removal of the dermabond glue on your incisions.     8. You may experience vaginal spotting after surgery or when the stitches at the top of the vagina begin to dissolve.  The spotting is normal but if you experience heavy bleeding, call our office.   9. Take Tylenol or ibuprofen first for pain if you are able to take these medications and only use narcotic pain medication for severe pain not relieved by the Tylenol or Ibuprofen.  Monitor your Tylenol intake to a max of 4,000 mg in a 24 hour period. You can  alternate these medications after surgery.   Diet: 1. Low sodium Heart Healthy Diet is recommended but you are cleared to resume your normal (before surgery) diet after your procedure.   2. It is safe to use a laxative, such as Miralax or Colace, if you have difficulty moving your bowels before surgery. You have been prescribed Sennakot-S to take at bedtime every evening after surgery to keep bowel movements regular and to prevent constipation.     Wound Care: 1. Keep clean and dry.  Shower daily.   Reasons to call the Doctor: Fever - Oral temperature greater than 100.4 degrees Fahrenheit Foul-smelling vaginal discharge Difficulty urinating Nausea and vomiting Increased pain at the site of the incision that is unrelieved with pain medicine. Difficulty breathing with or without chest pain New calf pain especially if only on one side Sudden, continuing increased vaginal bleeding with or without clots.   Contacts: For questions or concerns you should contact:   Dr. Clide Cliff at [phone]   Warner Mccreedy, NP at [phone]   After Hours: call [phone] and have the GYN Oncologist paged/contacted (after 5 pm or on the weekends). You will speak with an after hours RN and let he or she know you have had surgery.   Messages sent via mychart are for non-urgent  matters and are not responded to after hours so for urgent needs, please call the after hours number.

## 2024-10-17 NOTE — H&P (Signed)
 Brief Pre-operative History & Physical  Patient name: Adriana Newman CSN: [id number] MRN: [id number] Admit Date: 10/17/2024 Date of Surgery: 10/17/2024 Performing Service: Gynecology   Code Status: Prior    Assessment & Plan    Jennalynn is a 53 y.o. female with ENDOMETRIAL CANCER, who presents for: Procedure(s) (LRB): HYSTERECTOMY, TOTAL, ROBOT-ASSISTED, LAPAROSCOPIC, WITH BILATERAL SALPINGO-OOPHORECTOMY (Bilateral) INJECTION, FOR SENTINEL LYMPH NODE IDENTIFICATION (N/A) LYMPH NODE BIOPSY (N/A) LYMPHADENECTOMY, PELVIS, ROBOT-ASSISTED (N/A).   Consent obtained in office is accurate. Risks, benefits, and alternatives to surgery were reviewed, and all questions were answered.  Proceed to the OR as planned.     History of Present Illness:  Adriana Newman is a 53 y.o. female with ENDOMETRIAL CANCER. She was recently seen in clinic, where a detailed HPI can be found. She was noted to benefit from: Procedure(s) (LRB): HYSTERECTOMY, TOTAL, ROBOT-ASSISTED, LAPAROSCOPIC, WITH BILATERAL SALPINGO-OOPHORECTOMY (Bilateral) INJECTION, FOR SENTINEL LYMPH NODE IDENTIFICATION (N/A) LYMPH NODE BIOPSY (N/A) LYMPHADENECTOMY, PELVIS, ROBOT-ASSISTED (N/A).   Medical History Past Medical History:  Diagnosis Date   Anxiety    Cancer [facility])    Endometrial cancer   Depression    GERD (gastroesophageal reflux disease)    History of concussion    per pt as child with no residual   History of kidney stones    IDA (iron deficiency anemia)    Migraine headache    OSA on CPAP 2012   per pt mild osa and uses every night   PCOS (polycystic ovarian syndrome)    TAKES METFORMIN    PONV (postoperative nausea and vomiting)    prefers to have scope patch   Renal calculi    bilateral   Seasonal asthma    allergy induced   SUI (stress urinary incontinence, female)    Wears contact lenses    Surgical History Past Surgical History:  Procedure Laterality Date   BACK SURGERY  2002   L5-S1  fusion  and   L4-5-S1 redo 2012   CESAREAN SECTION  2006   CYSTOSCOPY/URETEROSCOPY/HOLMIUM LASER/STENT PLACEMENT Bilateral 08/20/2020   Procedure: CYSTOSCOPY/ RETROGRADES /URETEROSCOPY/HOLMIUM LASER LITHOTRIPSY /STENT PLACEMENT;  Surgeon: Nieves Cough, MD;  Location: [facility];  Service: Urology;  Laterality: Bilateral;   EXTRACORPOREAL SHOCK WAVE LITHOTRIPSY Right 01/24/2024   Procedure: RIGHT EXTRACORPOREAL SHOCK WAVE LITHOTRIPSY (ESWL);  Surgeon: Carolee Sherwood JONETTA DOUGLAS, MD;  Location: [facility];  Service: Urology;  Laterality: Right;  75 MINUTE CASE   HOLMIUM LASER APPLICATION Bilateral 08/20/2020   Procedure: HOLMIUM LASER APPLICATION;  Surgeon: Nieves Cough, MD;  Location: [facility];  Service: Urology;  Laterality: Bilateral;   KNEE ARTHROSCOPY W/ ACL RECONSTRUCTION  8010,8003   bilateral   REDUCTION MAMMAPLASTY Bilateral 2009   TOTAL KNEE ARTHROPLASTY Bilateral    Allergies Adhesive [tape], Morphine and codeine, Oxycodone, and Penicillins  Medications   Current Facility-Administered Medications  Medication Dose Route Frequency Provider Last Rate Last Admin   ceFAZolin (ANCEF) IVPB 2g/100 mL premix  2 g Intravenous On Call to OR Cross, Melissa D, NP       dexamethasone  (DECADRON ) injection 4 mg  4 mg Intravenous On Call to OR Cross, Eleanor D, NP       lactated ringers  infusion   Intravenous Continuous Niels Pons L, MD 10 mL/hr at 10/17/24 0657 Continued from Pre-op at 10/17/24 0657   metroNIDAZOLE (FLAGYL) IVPB 500 mg  500 mg Intravenous On Call to OR Cross, Eleanor JONETTA, NP       scopolamine  (TRANSDERM-SCOP) 1  MG/3DAYS 1 mg  1 patch Transdermal On Call to OR Cross, Melissa D, NP   1 mg at 10/17/24 0612   scopolamine  (TRANSDERM-SCOP) 1 MG/3DAYS 1 mg  1 patch Transdermal Q72H Paul Lamarr BRAVO, MD        Vital Signs BP (!) 145/91   Pulse 91   Temp 98.1 F (36.7 C) (Oral)   Resp 16   Ht 5' 7 (1.702 m)   Wt 224 lb (101.6  kg)   LMP 09/30/2024 (Approximate) Comment: POC pregnancy test (-) neg on 10/17/2024  SpO2 98%   BMI 35.08 kg/m  Facility age limit for growth %iles is 20 years. Facility age limit for growth %iles is 20 years..   Physical Exam General: Well developed, appears stated age, in no acute distress  Mental status: Alert and oriented x3 Cardiovascular: Normal Pulmonary: Symmetric chest rise, unlabored breathing Relevant System for Surgery: Surgical site examination deferred to the OR   Labs and Studies: Lab Results  Component Value Date   WBC 7.3 10/09/2024   HGB 13.6 10/09/2024   HCT 42.1 10/09/2024   PLT 316 10/09/2024    Lab Results  Component Value Date   INR 0.91 12/11/2011   APTT 33 12/11/2011   \

## 2024-10-17 NOTE — Anesthesia Postprocedure Evaluation (Signed)
 Anesthesia Post Note  Patient: Adriana Newman  Procedure(s) Performed: ROBOTIC ASSISTED TOTAL LAPAROSCOPIC HYSTERECTOMY WITH BILATERAL SALPINGO-OOPHORECTOMY (Bilateral) BILATERAL SENTINEL LYMPH NODE INJECTION (Bilateral) BILATERAL SENTINEL LYMPH NODE BIOPSY (Bilateral)     Patient location during evaluation: PACU Anesthesia Type: General Level of consciousness: awake and alert Pain management: pain level controlled Vital Signs Assessment: post-procedure vital signs reviewed and stable Respiratory status: spontaneous breathing, nonlabored ventilation and respiratory function stable Cardiovascular status: blood pressure returned to baseline Postop Assessment: no apparent nausea or vomiting Anesthetic complications: no   No notable events documented.  Last Vitals:  Vitals:   10/17/24 1145 10/17/24 1202  BP: 128/86 (!) 134/95  Pulse: 81 78  Resp: 15 14  Temp:  36.4 C  SpO2: 94% 96%    Last Pain:  Vitals:   10/17/24 1202  TempSrc:   PainSc: 3                  Vertell Row

## 2024-10-17 NOTE — Anesthesia Procedure Notes (Signed)
 Procedure Name: Intubation Date/Time: 10/17/2024 7:43 AM  Performed by: Augusta Daved SAILOR, CRNAPre-anesthesia Checklist: Patient identified, Emergency Drugs available, Suction available and Patient being monitored Patient Re-evaluated:Patient Re-evaluated prior to induction Oxygen Delivery Method: Circle System Utilized Preoxygenation: Pre-oxygenation with 100% oxygen Induction Type: IV induction Ventilation: Mask ventilation without difficulty and Oral airway inserted - appropriate to patient size Laryngoscope Size: Cleotilde and 2 Grade View: Grade I Tube type: Oral Tube size: 7.0 mm Number of attempts: 1 Airway Equipment and Method: Stylet and Oral airway Placement Confirmation: ETT inserted through vocal cords under direct vision, positive ETCO2 and breath sounds checked- equal and bilateral Secured at: 22 (at the lip) cm Tube secured with: Tape Dental Injury: Teeth and Oropharynx as per pre-operative assessment

## 2024-10-17 NOTE — Addendum Note (Signed)
 Addendum  created 10/17/24 1319 by Augusta Daved SAILOR, CRNA   LDA properties accepted

## 2024-10-17 NOTE — Op Note (Addendum)
 GYNECOLOGIC ONCOLOGY OPERATIVE NOTE  Date of Service: 10/17/2024  Preoperative Diagnosis: FIGO grade 1 endometrioid endometrial cancer  Postoperative Diagnosis: Same  Procedures: Robotic-assisted total laparoscopic hysterectomy, bilateral salpingo-oophorectomy, bilateral sentinel lymph node evaluation and biopsy  Surgeon: Hoy Masters, MD  Assistants: Olam Mill, MD and (an MD assistant was necessary for tissue manipulation, management of robotic instrumentation, retraction and positioning due to the complexity of the case and hospital policies)  Anesthesia: General  Estimated Blood Loss: 50 mL    Fluids: 1350 ml, crystalloid  Urine Output: 200 ml, clear yellow  Findings: Normal upper abdominal survey with normal liver surface and diaphragm. Normal appearing small bowel. Rectosigmoid colon with extensive diverticulosis. Normal uterus, tubes, and ovaries.  Left ovary with small simple appearing cyst.  No evidence of peritoneal disease, ascites, or carcinomatosis.  Small adhesion of the distal omentum to the inferior anterior abdominal wall at the location of the rectus diastases.Sentinel mapping on right to the obturator space; sentinel mapping on left with bifurcation of channels with one lymph node in the obturator space and one on the external iliac vein.  Specimens:  ID Type Source Tests Collected by Time Destination  1 : right obturator sentinel lymph node Tissue PATH Sentinel Lymph Node SURGICAL PATHOLOGY Masters Hoy, MD 10/17/2024 [phone]   2 : left external iliac sentinel lymph node Tissue PATH Sentinel Lymph Node SURGICAL PATHOLOGY Masters Hoy, MD 10/17/2024 [phone]   3 : left obturator sentinel lymph node Tissue PATH Sentinel Lymph Node SURGICAL PATHOLOGY Masters Hoy, MD 10/17/2024 [phone]   4 : uterus, cervix, bilateral tubes and ovaries Tissue PATH Gyn tumor resection SURGICAL PATHOLOGY Masters Hoy, MD 10/17/2024 [phone]     Complications:   None  Indications for Procedure: Adriana Newman is a 53 y.o. woman with biopsy showing FIGO grade 1 endometrioid endometrial cancer.  Prior to the procedure, all risks, benefits, and alternatives were discussed and informed surgical consent was signed.  Procedure: Patient was taken to the operating room where general anesthesia was achieved.  She was positioned in dorsal lithotomy and prepped and draped.  A foley catheter was inserted into the bladder. 1 ml of dilute Indo-Cyanine dye was was injected at 1cm and 1mm deep at 3 and 9 o'clock in the cervical stroma.  The cervix was dilated and an Advincula uterine manipulator with a colpotomy ring was inserted into the uterus.  A 12 mm incision was made in the left upper quadrant near Palmer's point.  The abdomen was entered with a 5 mm OptiView trocar under direct visualization.  The abdomen was insufflated, the patient placed in steep Trendelenburg, and additional trocars were placed as follows: an 8mm trocar superior to the umbilicus, two 8 mm robotic trocars in the right abdomen, and one 8 mm robotic trocar in the left abdomen.  The left upper quadrant trocar was removed and replaced with a 12 mm airseal trocar.  All trocars were placed under direct visualization.  The adhesion of the distal omentum to the inferior anterior abdominal wall was lysed with electrocautery.  The bowels were moved into the upper abdomen.  The DaVinci robotic surgical system was brought to the patient's bedside and docked.  The right round ligament was transected and the retroperitoneum entered.The right ureter was identified. The paravesical and pararectal spaces were opened, and the node was found to be located in the obturator space. A sentinel lymph node dissection was performed taking care to avoid injury to the ureter, superior vesicle artery or obturator nerve.  A similar procedure was performed on left.  On the left, there was opacification of the sentinel channels with  one sentinel lymph node located in the obturator space and the other sentinel lymph node located on the external iliac vein.The sentinel lymph nodes mentioned above were identified and removed through the assistant trocar.  The left ureter was again identified, and the left infundibulopelvic ligament was isolated, cauterized, and transected. The posterior peritoneum was opened to the colpotomy ring. The anterior peritoneum was opened and the bladder flap was created. The left uterine artery was skeletonized, cauterized, and transected at the level of the colpotomy ring. Additional cautery was used in a C-shaped fashion to allow the remainder of the broad, cardinal, and uterosacral ligaments with the uterine vessels to be transected and fall away from the colpotomy ring.  A similar procedure was performed on the contralateral side. A colpotomy was made circumferentially following the contours of the colpotomy ring.  The uterine specimen was removed through the vagina.    The vaginal cuff was closed with a running stitch of 0 Vicryl suture.  The pelvis was irrigated.  A figure-of-eight stitch with 3-0 Vicryl was placed on the posterior wall of the bladder at a punctate bleeding vessel with hemostasis achieved.  The pelvis was again irrigated and all operative sites were found to be hemostatic.  All instruments were removed and the robot was taken from the patient's bedside. The fascia at the 12 mm incision was closed with 0 Vicryl using a PMI device. The abdomen was desufflated and all ports were removed. The skin at all incisions was closed with 4-0 Vicryl to reapproximate the subcutaneous tissue and 4-0 monocryl in a subcuticular fashion followed by surgical glue.  The vagina was inspected and a superficial laceration of the posterior fourchette was noted.  A figure-of-eight stitch was placed with 3-0 Vicryl with hemostasis achieved.  Patient tolerated the procedure well. Sponge, lap, and instrument counts  were correct.  Patient received 2 gm of Ancef and 500 mg of metronidazole prior to skin incision for routine perioperative antibiotic prophylaxis.  She was extubated and taken to the PACU in stable condition.  Hoy Masters, MD Gynecologic Oncology

## 2024-10-18 ENCOUNTER — Telehealth: Payer: Self-pay | Admitting: *Deleted

## 2024-10-18 NOTE — Telephone Encounter (Signed)
 Spoke with Adriana Newman this morning. She states she is eating, drinking and urinating well. She has had a BM and is not passing much gas. She is taking senokot as prescribed and encouraged her to drink plenty of water. She denies fever or chills. Incisions are dry and intact. She rates her pain 4/10. Her pain is controlled with tylenol  and ibuprofen.     Instructed to call office with any fever, chills, purulent drainage, uncontrolled pain or any other questions or concerns. Patient verbalizes understanding.   Pt aware of post op appointments as well as the office number [phone] and after hours number [phone] to call if she has any questions or concerns

## 2024-10-23 ENCOUNTER — Telehealth: Payer: Self-pay

## 2024-10-23 ENCOUNTER — Encounter: Admitting: Psychiatry

## 2024-10-23 ENCOUNTER — Encounter (HOSPITAL_COMMUNITY): Payer: Self-pay | Admitting: Psychiatry

## 2024-10-23 NOTE — Telephone Encounter (Signed)
 S/P Robotic-assisted total laparoscopic hysterectomy, bilateral salpingo-oophorectomy, bilateral sentinel lymph node evaluation and biopsy 10/17/24 with Dr.Newton  Adriana Newman called this morning stating she recently had surgery with Dr. Eldonna on 10/28. Reports two days ago 11/1, she noticed constant pain and itching on the inside of her vagina. 2/10 on pain scale. No swelling, vaginal bleeding or discharge. No burning. Reports no UTI S&S (pain, pressure, or burning) she does have urgency/frequency. No vaginal redness, not on any abx. She is not taking any medication for treatment of pain/itching. No new soaps and has not applied anything to/in the vagina.   Pt aware message will be sent to provider for advice/recommendation. The office will call her back.   Pt lives in [place] and states she would have to have at least an hour to get here. She also states she doesn't mind going to an urgent care closer to home if this is what Dr.Newton suggests

## 2024-10-24 ENCOUNTER — Ambulatory Visit: Payer: Self-pay | Admitting: Psychiatry

## 2024-10-24 ENCOUNTER — Encounter: Payer: Self-pay | Admitting: Oncology

## 2024-10-24 NOTE — Progress Notes (Signed)
 Requested p53 on accession [id number] [id number] with [facility] via email.

## 2024-10-24 NOTE — Telephone Encounter (Signed)
 I spoke to [name] regarding her call from yesterday. I relayed advice/recommendations from Dr.Newton as well as Eleanor Epps NP. She states she is feeling better. She will use a squirt bottle for the next few days and monitor S&S. If anything gets worse she will call us  back.

## 2024-10-25 LAB — SURGICAL PATHOLOGY

## 2024-10-30 ENCOUNTER — Inpatient Hospital Stay: Attending: Psychiatry | Admitting: Psychiatry

## 2024-10-30 ENCOUNTER — Encounter: Payer: Self-pay | Admitting: Psychiatry

## 2024-10-30 VITALS — BP 116/74 | HR 98 | Temp 98.2°F | Resp 20 | Wt 223.0 lb

## 2024-10-30 DIAGNOSIS — J45998 Other asthma: Secondary | ICD-10-CM | POA: Insufficient documentation

## 2024-10-30 DIAGNOSIS — Z87820 Personal history of traumatic brain injury: Secondary | ICD-10-CM | POA: Diagnosis not present

## 2024-10-30 DIAGNOSIS — Z90710 Acquired absence of both cervix and uterus: Secondary | ICD-10-CM | POA: Insufficient documentation

## 2024-10-30 DIAGNOSIS — N83292 Other ovarian cyst, left side: Secondary | ICD-10-CM | POA: Insufficient documentation

## 2024-10-30 DIAGNOSIS — Z79899 Other long term (current) drug therapy: Secondary | ICD-10-CM | POA: Insufficient documentation

## 2024-10-30 DIAGNOSIS — C541 Malignant neoplasm of endometrium: Secondary | ICD-10-CM | POA: Insufficient documentation

## 2024-10-30 DIAGNOSIS — M069 Rheumatoid arthritis, unspecified: Secondary | ICD-10-CM | POA: Insufficient documentation

## 2024-10-30 DIAGNOSIS — Z803 Family history of malignant neoplasm of breast: Secondary | ICD-10-CM | POA: Diagnosis not present

## 2024-10-30 DIAGNOSIS — Z7189 Other specified counseling: Secondary | ICD-10-CM

## 2024-10-30 DIAGNOSIS — Z90722 Acquired absence of ovaries, bilateral: Secondary | ICD-10-CM | POA: Insufficient documentation

## 2024-10-30 DIAGNOSIS — K660 Peritoneal adhesions (postprocedural) (postinfection): Secondary | ICD-10-CM | POA: Diagnosis not present

## 2024-10-30 DIAGNOSIS — K5730 Diverticulosis of large intestine without perforation or abscess without bleeding: Secondary | ICD-10-CM | POA: Diagnosis not present

## 2024-10-30 DIAGNOSIS — Z87442 Personal history of urinary calculi: Secondary | ICD-10-CM | POA: Insufficient documentation

## 2024-10-30 DIAGNOSIS — Z9079 Acquired absence of other genital organ(s): Secondary | ICD-10-CM

## 2024-10-30 NOTE — Patient Instructions (Signed)
 It was a pleasure to see you in clinic today. - Healing well - No heavy lifting or strenuous exercising until 6 weeks postop. Nothing in the vagina until 10 weeks postop - Return visit planned for 6 months  Thank you very much for allowing me to provide care for you today.  I appreciate your confidence in choosing our Gynecologic Oncology team at [facility].  If you have any questions about your visit today please call our office or send us  a MyChart message and we will get back to you as soon as possible.

## 2024-10-30 NOTE — Progress Notes (Signed)
 Gynecologic Oncology Return Clinic Visit  Date of Service: 10/30/2024 Referring Provider: Cleotilde Planas, MD [address] [place],  [place] [address]  Assessment & Plan: Adriana Newman is a 53 y.o. woman with Stage IA1 FIGO grade 1 endometrioid endometrial cancer (no LVSI, p53wt, MMRp) who is s/p RA-TLH, BSO, blt SLNBx on 10/17/24.  Postop: - Pt recovering well from surgery and healing appropriately postoperatively - Ongoing postoperative expectations and precautions reviewed. Continue with no lifting >10lbs through 6 weeks postoperatively - Retired.  Endometrial cancer: - Pathology results reviewed in detail - No high-intermediate risk factors. - Recommend observation. - Signs/symptoms of recurrence reviewed. - Surveillance reviewed. Follow-up q6 months x2 years then yearly.  - Pt wishes to get her future follow-up in [place] given that she now lives in [place].    RTC 66mo (in [place]).  Hoy Masters, MD Gynecologic Oncology   Medical Decision Making I personally spent  TOTAL 16 minutes face-to-face and non-face-to-face in the care of this patient, which includes all pre, intra, and post visit time on the date of service. The discussion of treatment of endometrial cancer is beyond the scope of routine postoperative care.   ----------------------- Reason for Visit: Postop/treatment discussion  Treatment History: Oncology History  Endometrial cancer (HCC)  08/16/2024 Initial Biopsy   Endometrial biopsy: FIGO grade 1 endometrioid endometrial cancer, MMRp    08/16/2024 Initial Diagnosis   Endometrial cancer (HCC)   10/17/2024 Surgery   Procedures: Robotic-assisted total laparoscopic hysterectomy, bilateral salpingo-oophorectomy, bilateral sentinel lymph node evaluation and biopsy   Findings: Normal upper abdominal survey with normal liver surface and diaphragm. Normal appearing small bowel. Rectosigmoid colon with extensive diverticulosis. Normal  uterus, tubes, and ovaries.  Left ovary with small simple appearing cyst.  No evidence of peritoneal disease, ascites, or carcinomatosis.  Small adhesion of the distal omentum to the inferior anterior abdominal wall at the location of the rectus diastases.Sentinel mapping on right to the obturator space; sentinel mapping on left with bifurcation of channels with one lymph node in the obturator space and one on the external iliac vein.    10/17/2024 Pathology Results   A. SENTINEL LYMPH NODE, RIGHT OBTURATOR, BIOPSY: - One lymph node, negative for carcinoma (0/1). - Immunohistochemical stain for AE1/AE3 will follow in an addendum.  B. SENTINEL LYMPH NODE, LEFT EXTERNAL ILIAC, BIOPSY: - One lymph node, negative for carcinoma (0/1). - Immunohistochemical stain for AE1/AE3 will follow in an addendum.  C. SENTINEL LYMPH NODE, LEFT OBTURATOR, BIOPSY: - One lymph node, negative for carcinoma (0/1). - Immunohistochemical stain for AE1/AE3 will follow in an addendum.  D. UTERUS, CERVIX, FALLOPIAN TUBE, OVARY, BILATERAL, HYSTERECTOMY: - Endometrial adenocarcinoma, endometrioid type with squamous metaplasia, FIGO grade 1, limited to the endometrium (see synoptic report).  CASE SUMMARY: (ENDOMETRIUM) Standard(s): AJCC 8, FIGO 2009 Staging (2018 Annual Report), FIGO 2023 Staging  SPECIMEN Procedure: Total hysterectomy and bilateral salpingo-oophorectomy  TUMOR Histologic Type: Endometrioid carcinoma Histologic Grade: FIGO grade 1 Molecular Type: - MMR Immunohistochemistry: Intact nuclear expression of MLH1, PMS2, MSH2 and MSH6 (performed on outside biopsy) - p53 Immunohistochemistry: Not performed Myometrial Invasion: Not identified Uterine Serosa Involvement: Not identified Cervical Involvement: Not identified Other Tissue/Organ Involvement: Not identified (other tissue/organ submitted and not involved) Lymphatic and/or Vascular Invasion: Not identified  MARGINS Margin Status: All  margins negative for carcinoma  REGIONAL LYMPH NODES Regional Lymph Node Status: Regional lymph nodes present All regional lymph nodes negative for tumor cells Lymph Nodes Examined: - Total Number of Pelvic  Nodes Examined (sentinel and non-sentinel): 3 - Number of Pelvic Sentinel Nodes Examined: 3 - Total Number of Para-aortic Nodes Examined (sentinel and non-sentinel): 0 - Number of Para-aortic Sentinel Nodes Examined: 0  DISTANT METASTASIS Distant Site(s) Involved, if applicable: Not applicable  PATHOLOGIC STAGE CLASSIFICATION (pTNM, AJCC 8th Edition): Modified Classification: Not applicable pT1a - T Suffix: Not applicable pN0 - N Suffix: (sn) pM - Not applicable  ADDITIONAL FINDINGS: Benign physiologic changes of the ovaries, paratubal cyst (v5.1.0.0)   ADDENDUM:  An immunohistochemical stain for p53 is wildtype.      Interval History: Pt reports that she is recovering well from surgery. She is not needing anything for pain. She is eating and drinking well. She is voiding without issue and having regular bowel movements. No bleeding. Having hot flashes.    Past Medical/Surgical History: Past Medical History:  Diagnosis Date   Anxiety    Cancer (HCC)    Endometrial cancer   Depression    GERD (gastroesophageal reflux disease)    History of concussion    per pt as child with no residual   History of kidney stones    IDA (iron deficiency anemia)    Migraine headache    OSA on CPAP 2012   per pt mild osa and uses every night   PCOS (polycystic ovarian syndrome)    TAKES METFORMIN    PONV (postoperative nausea and vomiting)    prefers to have scope patch   Renal calculi    bilateral   Seasonal asthma    allergy induced   SUI (stress urinary incontinence, female)    Wears contact lenses     Past Surgical History:  Procedure Laterality Date   BACK SURGERY  2002   L5-S1 fusion  and   L4-5-S1 redo 2012   CESAREAN SECTION  2006    CYSTOSCOPY/URETEROSCOPY/HOLMIUM LASER/STENT PLACEMENT Bilateral 08/20/2020   Procedure: CYSTOSCOPY/ RETROGRADES /URETEROSCOPY/HOLMIUM LASER LITHOTRIPSY /STENT PLACEMENT;  Surgeon: Nieves Cough, MD;  [facility];  Service: Urology;  Laterality: Bilateral;   EXTRACORPOREAL SHOCK WAVE LITHOTRIPSY Right 01/24/2024   Procedure: RIGHT EXTRACORPOREAL SHOCK WAVE LITHOTRIPSY (ESWL);  Surgeon: Carolee Sherwood JONETTA DOUGLAS, MD;  Location: [facility];  Service: Urology;  Laterality: Right;  75 MINUTE CASE   HOLMIUM LASER APPLICATION Bilateral 08/20/2020   Procedure: HOLMIUM LASER APPLICATION;  Surgeon: Nieves Cough, MD;  Location[facility];  Service: Urology;  Laterality: Bilateral;   INJECTION, FOR SENTINEL LYMPH NODE IDENTIFICATION Bilateral 10/17/2024   Procedure: BILATERAL SENTINEL LYMPH NODE INJECTION;  Surgeon: Eldonna Mays, MD;  Location: [facility];  Service: Gynecology;  Laterality: Bilateral;   KNEE ARTHROSCOPY W/ ACL RECONSTRUCTION  8010,8003   bilateral   LYMPH NODE BIOPSY Bilateral 10/17/2024   Procedure: BILATERAL SENTINEL LYMPH NODE BIOPSY;  Surgeon: Eldonna Mays, MD;  Location: [facility];  Service: Gynecology;  Laterality: Bilateral;   REDUCTION MAMMAPLASTY Bilateral 2009   ROBOTIC ASSISTED TOTAL HYSTERECTOMY WITH BILATERAL SALPINGO OOPHERECTOMY Bilateral 10/17/2024   Procedure: ROBOTIC ASSISTED TOTAL LAPAROSCOPIC HYSTERECTOMY WITH BILATERAL SALPINGO-OOPHORECTOMY;  Surgeon: Eldonna Mays, MD;  Location: [facility];  Service: Gynecology;  Laterality: Bilateral;   TOTAL KNEE ARTHROPLASTY Bilateral     Family History  Problem Relation Age of Onset   Breast cancer Maternal Aunt    Ovarian cancer Neg Hx    Colon cancer Neg Hx    Endometrial cancer Neg Hx     Social History   Socioeconomic History   Marital status: Divorced  Spouse name: Not on file   Number of children: Not on file   Years of education: Not on file   Highest  education level: Not on file  Occupational History   Not on file  Tobacco Use   Smoking status: Never    Passive exposure: Never   Smokeless tobacco: Never  Vaping Use   Vaping status: Never Used  Substance and Sexual Activity   Alcohol use: Not Currently   Drug use: Never   Sexual activity: Not Currently  Other Topics Concern   Not on file  Social History Narrative   Not on file   Social Drivers of Health   Financial Resource Strain: Not on file  Food Insecurity: No Food Insecurity (09/06/2024)   Hunger Vital Sign    Worried About Running Out of Food in the Last Year: Never true    Ran Out of Food in the Last Year: Never true  Transportation Needs: No Transportation Needs (09/06/2024)   PRAPARE - Administrator, Civil Service (Medical): No    Lack of Transportation (Non-Medical): No  Physical Activity: Not on file  Stress: Not on file  Social Connections: Not on file    Current Medications:  Current Outpatient Medications:    Atogepant 60 MG TABS, Take 60 mg by mouth in the morning., Disp: , Rfl:    buPROPion (WELLBUTRIN XL) 300 MG 24 hr tablet, Take 300 mg by mouth in the morning., Disp: , Rfl:    CALCIUM PO, Take 1,000 mg by mouth every evening., Disp: , Rfl:    DULoxetine  (CYMBALTA ) 30 MG capsule, Take 60 mg by mouth in the morning., Disp: , Rfl:    esomeprazole (NEXIUM) 20 MG capsule, Take 20 mg by mouth daily before breakfast., Disp: , Rfl:    Ferrous Sulfate  (IRON) 325 (65 FE) MG TABS, Take 325 mg by mouth in the morning., Disp: , Rfl:    fluticasone  (FLONASE  SENSIMIST) 27.5 MCG/SPRAY nasal spray, Place 2 sprays into the nose in the morning., Disp: , Rfl:    ibuprofen (ADVIL) 200 MG tablet, Take 400 mg by mouth every 8 (eight) hours as needed., Disp: , Rfl:    levocetirizine (XYZAL) 5 MG tablet, Take 5 mg by mouth every evening., Disp: , Rfl:    metFORMIN  (GLUCOPHAGE -XR) 500 MG 24 hr tablet, TAKE 2 TABLETS BY MOUTH TWICE DAILY WITH MEAL, Disp: , Rfl:     montelukast (SINGULAIR) 10 MG tablet, Take 10 mg by mouth at bedtime., Disp: , Rfl:    Multiple Vitamin (MULTIVITAMIN WITH MINERALS) TABS tablet, Take 1 tablet by mouth every evening. Women's Multivitamin, Disp: , Rfl:    NURTEC 75 MG TBDP, DISSOLVE 1 TABLET ON THE TONGUE EVERY 24 HOUR AT ONSET OF HEADACHE AS NEEDED. DO NOT EXCEED 1 TABLET / 24 HOURS, Disp: , Rfl:    senna-docusate (SENOKOT-S) 8.6-50 MG tablet, Take 2 tablets by mouth at bedtime. For AFTER surgery, do not take if having diarrhea, Disp: 30 tablet, Rfl: 0   topiramate (TOPAMAX) 100 MG tablet, Take 100 mg by mouth at bedtime., Disp: , Rfl:    traMADol  (ULTRAM ) 50 MG tablet, Take 1 tablet (50 mg total) by mouth every 6 (six) hours as needed for moderate pain (pain score 4-6). For AFTER surgery only, do not take and drive, Disp: 10 tablet, Rfl: 0   vitamin B-12 (CYANOCOBALAMIN) 1000 MCG tablet, Take 1,000 mcg by mouth in the morning., Disp: , Rfl:    VITAMIN D PO,  Take 1,000 Units by mouth in the morning., Disp: , Rfl:   Review of Symptoms: Complete 10-system review is positive for: none  Physical Exam: BP 116/74 (BP Location: Right Arm, Patient Position: Sitting)   Pulse 98   Temp 98.2 F (36.8 C) (Oral)   Resp 20   Wt 223 lb (101.2 kg)   LMP 09/30/2024 (Approximate) Comment: POC pregnancy test (-) neg on 10/17/2024  SpO2 98%   BMI 34.93 kg/m  General: Alert, oriented, no acute distress. HEENT: Normocephalic, atraumatic. Neck symmetric without masses. Sclera anicteric.  Chest: Normal work of breathing. Clear to auscultation bilaterally.   Cardiovascular: Regular rate and rhythm, no murmurs. Abdomen: Soft, nontender.  Normoactive bowel sounds.  No masses appreciated.  Well-healing incisions with glue, resolving bruising near LUQ incision. Extremities: Grossly normal range of motion.  Warm, well perfused.  No edema bilaterally. Skin: No rashes or lesions noted. GU: Normal appearing external genitalia without erythema,  excoriation, or lesions.  Speculum exam reveals intact vaginal cuff.  Bimanual exam reveals intact vaginal cuff. Exam chaperoned by Kimberly Jordan, CMA   Laboratory & Radiologic Studies: Surgical pathology (10/17/24): A. SENTINEL LYMPH NODE, RIGHT OBTURATOR, BIOPSY: - One lymph node, negative for carcinoma (0/1). - Immunohistochemical stain for AE1/AE3 will follow in an addendum.  B. SENTINEL LYMPH NODE, LEFT EXTERNAL ILIAC, BIOPSY: - One lymph node, negative for carcinoma (0/1). - Immunohistochemical stain for AE1/AE3 will follow in an addendum.  C. SENTINEL LYMPH NODE, LEFT OBTURATOR, BIOPSY: - One lymph node, negative for carcinoma (0/1). - Immunohistochemical stain for AE1/AE3 will follow in an addendum.  D. UTERUS, CERVIX, FALLOPIAN TUBE, OVARY, BILATERAL, HYSTERECTOMY: - Endometrial adenocarcinoma, endometrioid type with squamous metaplasia, FIGO grade 1, limited to the endometrium (see synoptic report).

## 2024-11-08 ENCOUNTER — Telehealth: Payer: Self-pay

## 2024-11-08 NOTE — Telephone Encounter (Signed)
 Per Dr. Eldonna, ok for Adriana Newman to follow up at the [facility] office since she lives in [place]  Pt is scheduled for a 6 month follow up appointment with Dr.Newton at [facility] on 05/04/25 @ 10:45

## 2025-04-30 ENCOUNTER — Inpatient Hospital Stay: Attending: Psychiatry | Admitting: Psychiatry
